# Patient Record
Sex: Female | Born: 2000 | Race: White | Hispanic: No | Marital: Single | Smoking: Never smoker
Health system: Southern US, Community
[De-identification: ages and names within clinical notes are randomized; demographics above are authoritative.]

## PROBLEM LIST (undated history)

## (undated) DIAGNOSIS — K219 Gastro-esophageal reflux disease without esophagitis: Secondary | ICD-10-CM

## (undated) DIAGNOSIS — R1013 Epigastric pain: Secondary | ICD-10-CM

## (undated) DIAGNOSIS — J45909 Unspecified asthma, uncomplicated: Secondary | ICD-10-CM

## (undated) HISTORY — DX: Epigastric pain: R10.13

## (undated) HISTORY — PX: WISDOM TOOTH EXTRACTION: SHX21

---
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
## 2020-01-12 MED FILL — ADAPALENE 0.3% GEL: 0.3 | 30 days supply | Qty: 45 | Fill #1

## 2020-01-15 MED FILL — SOD SULFACET-SULFUR 10-5% C: 10-5 | 30 days supply | Qty: 170 | Fill #0

## 2020-01-22 DIAGNOSIS — L700 Acne vulgaris: Secondary | ICD-10-CM | POA: Diagnosis not present

## 2020-01-22 DIAGNOSIS — L218 Other seborrheic dermatitis: Secondary | ICD-10-CM | POA: Diagnosis not present

## 2020-01-22 MED FILL — FLUOCINONIDE 0.05 % SOLN: 0.05 | 30 days supply | Qty: 60 | Fill #0

## 2020-01-31 MED FILL — KETOCONAZOLE 2 % SHAM: 2 | 30 days supply | Qty: 120 | Fill #0

## 2020-02-15 ENCOUNTER — Ambulatory Visit: Payer: 59 | Attending: Internal Medicine

## 2020-02-15 DIAGNOSIS — Z23 Encounter for immunization: Secondary | ICD-10-CM

## 2020-02-15 NOTE — Progress Notes (Signed)
   Covid-19 Vaccination Clinic  Name:  Azalyn Sliwa    MRN: [id number] DOB: 2001/01/04  02/15/2020  Ms. Garraway was observed post Covid-19 immunization for 15 minutes without incident. She was provided with Vaccine Information Sheet and instruction to access the V-Safe system.   Ms. Eblen was instructed to call 911 with any severe reactions post vaccine: Marland Kitchen Difficulty breathing  . Swelling of face and throat  . A fast heartbeat  . A bad rash all over body  . Dizziness and weakness   Immunizations Administered    Name Date Dose VIS Date Route   Pfizer COVID-19 Vaccine 02/15/2020 10:21 AM 0.3 mL 10/20/2019 Intramuscular   Manufacturer: [employer], [employer]   Lot: [id number]   NDC: [address]-3

## 2020-03-20 ENCOUNTER — Ambulatory Visit: Payer: 59 | Attending: Internal Medicine

## 2020-03-20 DIAGNOSIS — Z23 Encounter for immunization: Secondary | ICD-10-CM

## 2020-03-20 NOTE — Progress Notes (Signed)
   Covid-19 Vaccination Clinic  Name:  Amanda Hester    MRN: [id number] DOB: 2000-12-26  03/20/2020  Ms. Cancel was observed post Covid-19 immunization for 15 minutes without incident. She was provided with Vaccine Information Sheet and instruction to access the V-Safe system.   [name] was instructed to call 911 with any severe reactions post vaccine: Marland Kitchen Difficulty breathing  . Swelling of face and throat  . A fast heartbeat  . A bad rash all over body  . Dizziness and weakness   Immunizations Administered    Name Date Dose VIS Date Route   Pfizer COVID-19 Vaccine 03/20/2020 10:08 AM 0.3 mL 01/03/2019 Intramuscular   Manufacturer: [employer], [employer]   Lot: [id number]   NDC: [address]-0

## 2020-03-26 MED FILL — SOD SULFACET-SULFUR 10-5% C: 10-5 | 30 days supply | Qty: 170 | Fill #1

## 2020-03-28 MED FILL — KETOROLAC 10 MG TABLET: 10 | 4 days supply | Qty: 16 | Fill #0

## 2020-03-28 MED FILL — HYDROCODON-APAP 7.5-325: 7.5-325 | 2 days supply | Qty: 8 | Fill #0

## 2020-04-02 DIAGNOSIS — M79651 Pain in right thigh: Secondary | ICD-10-CM | POA: Diagnosis not present

## 2020-05-17 DIAGNOSIS — Z130 Encounter for screening for diseases of the blood and blood-forming organs and certain disorders involving the immune mechanism: Secondary | ICD-10-CM | POA: Diagnosis not present

## 2020-05-17 DIAGNOSIS — J301 Allergic rhinitis due to pollen: Secondary | ICD-10-CM | POA: Diagnosis not present

## 2020-05-17 DIAGNOSIS — J45990 Exercise induced bronchospasm: Secondary | ICD-10-CM | POA: Diagnosis not present

## 2020-05-17 MED FILL — ALBUTEROL SULFATE HFA 108 (: 108 (90 BAS | 16 days supply | Qty: 9 | Fill #0

## 2020-05-21 MED FILL — KETOCONAZOLE 2 % SHAM: 2 | 30 days supply | Qty: 120 | Fill #0

## 2020-05-21 MED FILL — DOXYCYCLINE HYC 50 MG CAP: 50 | 20 days supply | Qty: 20 | Fill #1

## 2020-05-21 MED FILL — SOD SULFACET-SULFUR 10-5% C: 10-5 | 30 days supply | Qty: 170 | Fill #2

## 2020-08-12 DIAGNOSIS — M25572 Pain in left ankle and joints of left foot: Secondary | ICD-10-CM | POA: Diagnosis not present

## 2020-08-12 DIAGNOSIS — S93492A Sprain of other ligament of left ankle, initial encounter: Secondary | ICD-10-CM | POA: Diagnosis not present

## 2020-08-21 DIAGNOSIS — S93492D Sprain of other ligament of left ankle, subsequent encounter: Secondary | ICD-10-CM | POA: Diagnosis not present

## 2020-08-21 DIAGNOSIS — M25572 Pain in left ankle and joints of left foot: Secondary | ICD-10-CM | POA: Diagnosis not present

## 2020-08-26 MED FILL — SOD SULFACET-SULFUR 10-5% C: 10-5 | 30 days supply | Qty: 170 | Fill #3

## 2020-08-26 MED FILL — FLUOCINONIDE 0.05 % SOLN: 0.05 | 30 days supply | Qty: 60 | Fill #0

## 2020-08-26 MED FILL — ADAPALENE 0.3% GEL: 0.3 | 30 days supply | Qty: 45 | Fill #2

## 2020-08-27 ENCOUNTER — Other Ambulatory Visit (HOSPITAL_COMMUNITY): Payer: Self-pay | Admitting: Specialist

## 2020-08-27 MED FILL — DOXYCYCLINE HYC 50 MG CAP: 50 | 50 days supply | Qty: 50 | Fill #0

## 2020-09-02 MED FILL — KETOCONAZOLE 2 % SHAM: 2 | 30 days supply | Qty: 120 | Fill #1

## 2020-09-19 DIAGNOSIS — R600 Localized edema: Secondary | ICD-10-CM | POA: Diagnosis not present

## 2020-09-19 DIAGNOSIS — S93422A Sprain of deltoid ligament of left ankle, initial encounter: Secondary | ICD-10-CM | POA: Diagnosis not present

## 2020-09-19 DIAGNOSIS — S93492A Sprain of other ligament of left ankle, initial encounter: Secondary | ICD-10-CM | POA: Diagnosis not present

## 2020-09-19 DIAGNOSIS — S93412A Sprain of calcaneofibular ligament of left ankle, initial encounter: Secondary | ICD-10-CM | POA: Diagnosis not present

## 2020-09-19 DIAGNOSIS — M25571 Pain in right ankle and joints of right foot: Secondary | ICD-10-CM | POA: Diagnosis not present

## 2020-09-25 DIAGNOSIS — S93492D Sprain of other ligament of left ankle, subsequent encounter: Secondary | ICD-10-CM | POA: Diagnosis not present

## 2020-09-27 DIAGNOSIS — M79672 Pain in left foot: Secondary | ICD-10-CM | POA: Diagnosis not present

## 2020-09-27 DIAGNOSIS — M25572 Pain in left ankle and joints of left foot: Secondary | ICD-10-CM | POA: Diagnosis not present

## 2020-09-27 DIAGNOSIS — S93492A Sprain of other ligament of left ankle, initial encounter: Secondary | ICD-10-CM | POA: Diagnosis not present

## 2020-10-01 ENCOUNTER — Ambulatory Visit: Payer: 59 | Admitting: Podiatry

## 2020-10-15 ENCOUNTER — Encounter (INDEPENDENT_AMBULATORY_CARE_PROVIDER_SITE_OTHER): Payer: Self-pay | Admitting: Student in an Organized Health Care Education/Training Program

## 2020-10-23 DIAGNOSIS — S93492A Sprain of other ligament of left ankle, initial encounter: Secondary | ICD-10-CM | POA: Diagnosis not present

## 2020-10-23 DIAGNOSIS — M25372 Other instability, left ankle: Secondary | ICD-10-CM | POA: Diagnosis not present

## 2020-10-28 ENCOUNTER — Other Ambulatory Visit: Payer: Self-pay | Admitting: Orthopaedic Surgery

## 2020-10-28 ENCOUNTER — Other Ambulatory Visit (HOSPITAL_COMMUNITY): Payer: Self-pay | Admitting: Orthopaedic Surgery

## 2020-10-28 DIAGNOSIS — M25372 Other instability, left ankle: Secondary | ICD-10-CM

## 2020-10-30 ENCOUNTER — Encounter (HOSPITAL_BASED_OUTPATIENT_CLINIC_OR_DEPARTMENT_OTHER): Payer: Self-pay | Admitting: Orthopaedic Surgery

## 2020-10-30 ENCOUNTER — Other Ambulatory Visit: Payer: Self-pay

## 2020-11-04 ENCOUNTER — Other Ambulatory Visit: Payer: Self-pay

## 2020-11-04 ENCOUNTER — Other Ambulatory Visit (HOSPITAL_COMMUNITY)
Admission: RE | Admit: 2020-11-04 | Discharge: 2020-11-04 | Disposition: A | Discharge status: Home / Self Care | Payer: 59 | Source: Ambulatory Visit

## 2020-11-04 ENCOUNTER — Ambulatory Visit (HOSPITAL_COMMUNITY)
Admission: RE | Admit: 2020-11-04 | Discharge: 2020-11-04 | Disposition: A | Discharge status: Home / Self Care | Payer: 59 | Source: Ambulatory Visit | Attending: Orthopaedic Surgery | Admitting: Orthopaedic Surgery

## 2020-11-04 DIAGNOSIS — M25372 Other instability, left ankle: Secondary | ICD-10-CM | POA: Diagnosis not present

## 2020-11-04 DIAGNOSIS — Z20822 Contact with and (suspected) exposure to covid-19: Secondary | ICD-10-CM | POA: Diagnosis not present

## 2020-11-04 DIAGNOSIS — S99912A Unspecified injury of left ankle, initial encounter: Secondary | ICD-10-CM | POA: Diagnosis not present

## 2020-11-04 MED FILL — KETOCONAZOLE 2 % SHAM: 2 | 30 days supply | Qty: 120 | Fill #2

## 2020-11-04 MED FILL — SOD SULFACET-SULFUR 10-5% C: 10-5 | 30 days supply | Qty: 170 | Fill #4

## 2020-11-04 MED FILL — ADAPALENE 0.3% GEL: 0.3 | 30 days supply | Qty: 45 | Fill #3

## 2020-11-05 LAB — SARS CORONAVIRUS 2 (TAT 6-24 HRS): SARS Coronavirus 2: NEGATIVE

## 2020-11-05 NOTE — Progress Notes (Signed)

## 2020-11-06 NOTE — Anesthesia Preprocedure Evaluation (Addendum)
Anesthesia Evaluation  Patient identified by MRN, date of birth, ID band Patient awake    Reviewed: Allergy & Precautions, NPO status , Patient's Chart, lab work & pertinent test results  Airway Mallampati: II  TM Distance: >3 FB Neck ROM: Full    Dental no notable dental hx. (+) Teeth Intact, Dental Advisory Given   Pulmonary neg pulmonary ROS,    Pulmonary exam normal breath sounds clear to auscultation       Cardiovascular Exercise Tolerance: Good Normal cardiovascular exam Rhythm:Regular Rate:Normal     Neuro/Psych negative neurological ROS     GI/Hepatic Neg liver ROS, GERD  ,  Endo/Other  negative endocrine ROS  Renal/GU negative Renal ROS     Musculoskeletal negative musculoskeletal ROS (+)   Abdominal   Peds  Hematology negative hematology ROS (+)   Anesthesia Other Findings   Reproductive/Obstetrics                            Anesthesia Physical Anesthesia Plan  ASA: I  Anesthesia Plan: General   Post-op Pain Management:    Induction: Intravenous  PONV Risk Score and Plan: Treatment may vary due to age or medical condition, Ondansetron, Dexamethasone and Midazolam  Airway Management Planned: LMA  Additional Equipment: None  Intra-op Plan:   Post-operative Plan:   Informed Consent: I have reviewed the patients History and Physical, chart, labs and discussed the procedure including the risks, benefits and alternatives for the proposed anesthesia with the patient or authorized representative who has indicated his/her understanding and acceptance.     Dental advisory given  Plan Discussed with: CRNA and Anesthesiologist  Anesthesia Plan Comments: (LMA w L popliteal + adductor)       Anesthesia Quick Evaluation

## 2020-11-07 ENCOUNTER — Other Ambulatory Visit: Payer: Self-pay

## 2020-11-07 ENCOUNTER — Ambulatory Visit (HOSPITAL_BASED_OUTPATIENT_CLINIC_OR_DEPARTMENT_OTHER)
Admission: RE | Admit: 2020-11-07 | Discharge: 2020-11-07 | Disposition: A | Discharge status: Home / Self Care | Payer: 59 | Attending: Orthopaedic Surgery | Admitting: Orthopaedic Surgery

## 2020-11-07 ENCOUNTER — Ambulatory Visit (HOSPITAL_BASED_OUTPATIENT_CLINIC_OR_DEPARTMENT_OTHER): Payer: 59 | Admitting: Anesthesiology

## 2020-11-07 ENCOUNTER — Other Ambulatory Visit (HOSPITAL_BASED_OUTPATIENT_CLINIC_OR_DEPARTMENT_OTHER): Payer: Self-pay | Admitting: Orthopaedic Surgery

## 2020-11-07 ENCOUNTER — Encounter (HOSPITAL_BASED_OUTPATIENT_CLINIC_OR_DEPARTMENT_OTHER): Payer: Self-pay | Admitting: Orthopaedic Surgery

## 2020-11-07 ENCOUNTER — Encounter (HOSPITAL_BASED_OUTPATIENT_CLINIC_OR_DEPARTMENT_OTHER)
Admission: RE | Disposition: A | Discharge status: Home / Self Care | Payer: Self-pay | Source: Home / Self Care | Attending: Orthopaedic Surgery

## 2020-11-07 DIAGNOSIS — Y9366 Activity, soccer: Secondary | ICD-10-CM | POA: Diagnosis not present

## 2020-11-07 DIAGNOSIS — M25372 Other instability, left ankle: Secondary | ICD-10-CM | POA: Insufficient documentation

## 2020-11-07 DIAGNOSIS — M25872 Other specified joint disorders, left ankle and foot: Secondary | ICD-10-CM | POA: Insufficient documentation

## 2020-11-07 DIAGNOSIS — Z79899 Other long term (current) drug therapy: Secondary | ICD-10-CM | POA: Diagnosis not present

## 2020-11-07 DIAGNOSIS — G8918 Other acute postprocedural pain: Secondary | ICD-10-CM | POA: Diagnosis not present

## 2020-11-07 DIAGNOSIS — M19072 Primary osteoarthritis, left ankle and foot: Secondary | ICD-10-CM | POA: Diagnosis not present

## 2020-11-07 HISTORY — DX: Unspecified asthma, uncomplicated: J45.909

## 2020-11-07 HISTORY — DX: Gastro-esophageal reflux disease without esophagitis: K21.9

## 2020-11-07 HISTORY — PX: ANKLE ARTHROSCOPY WITH RECONSTRUCTION: SHX5583

## 2020-11-07 LAB — POCT PREGNANCY, URINE: Preg Test, Ur: NEGATIVE

## 2020-11-07 SURGERY — ARTHROSCOPY, ANKLE, WITH RECONSTRUCTION
Anesthesia: Regional | Site: Ankle | Laterality: Left

## 2020-11-07 MED ORDER — CEFAZOLIN SODIUM-DEXTROSE 2-4 GM/100ML-% IV SOLN
2.0000 g | INTRAVENOUS | Status: AC
  Administered 2020-11-07: 10:00:00 2 g via INTRAVENOUS

## 2020-11-07 MED ORDER — DEXAMETHASONE SODIUM PHOSPHATE 10 MG/ML IJ SOLN
INTRAMUSCULAR | Status: DC | PRN
  Administered 2020-11-07: 5 mg via INTRAVENOUS

## 2020-11-07 MED ORDER — DEXMEDETOMIDINE (PRECEDEX) IN NS 20 MCG/5ML (4 MCG/ML) IV SYRINGE
PREFILLED_SYRINGE | INTRAVENOUS | Status: AC
  Filled 2020-11-07: qty 5

## 2020-11-07 MED ORDER — DEXMEDETOMIDINE (PRECEDEX) IN NS 20 MCG/5ML (4 MCG/ML) IV SYRINGE
PREFILLED_SYRINGE | INTRAVENOUS | Status: DC | PRN
  Administered 2020-11-07: 8 ug via INTRAVENOUS

## 2020-11-07 MED ORDER — GLYCOPYRROLATE 0.2 MG/ML IJ SOLN
INTRAMUSCULAR | Status: DC | PRN
  Administered 2020-11-07: .2 mg via INTRAVENOUS

## 2020-11-07 MED ORDER — OXYCODONE HCL 5 MG/5ML PO SOLN
5.0000 mg | Freq: Once | ORAL | Status: DC | PRN
Start: 2020-11-07 — End: 2020-11-07

## 2020-11-07 MED ORDER — MIDAZOLAM HCL 2 MG/2ML IJ SOLN
INTRAMUSCULAR | Status: AC
  Filled 2020-11-07: qty 2

## 2020-11-07 MED ORDER — FENTANYL CITRATE (PF) 100 MCG/2ML IJ SOLN
INTRAMUSCULAR | Status: DC | PRN
  Administered 2020-11-07: 50 ug via INTRAVENOUS
  Administered 2020-11-07: 25 ug via INTRAVENOUS

## 2020-11-07 MED ORDER — MIDAZOLAM HCL 2 MG/2ML IJ SOLN
1.0000 mg | Freq: Once | INTRAMUSCULAR | Status: AC
  Administered 2020-11-07: 09:00:00 2 mg via INTRAVENOUS

## 2020-11-07 MED ORDER — FENTANYL CITRATE (PF) 100 MCG/2ML IJ SOLN
50.0000 ug | Freq: Once | INTRAMUSCULAR | Status: AC
  Administered 2020-11-07: 09:00:00 100 ug via INTRAVENOUS

## 2020-11-07 MED ORDER — LACTATED RINGERS IV SOLN: INTRAVENOUS | Status: DC

## 2020-11-07 MED ORDER — PROPOFOL 10 MG/ML IV BOLUS
INTRAVENOUS | Status: DC | PRN
  Administered 2020-11-07: 120 mg via INTRAVENOUS

## 2020-11-07 MED ORDER — OXYCODONE HCL 5 MG PO TABS: 5.0000 mg | ORAL_TABLET | Freq: Once | ORAL | Status: DC | PRN

## 2020-11-07 MED ORDER — LIDOCAINE 2% (20 MG/ML) 5 ML SYRINGE
INTRAMUSCULAR | Status: AC
  Filled 2020-11-07: qty 5

## 2020-11-07 MED ORDER — ONDANSETRON HCL 4 MG/2ML IJ SOLN
INTRAMUSCULAR | Status: AC
  Filled 2020-11-07: qty 2

## 2020-11-07 MED ORDER — KETOROLAC TROMETHAMINE 30 MG/ML IJ SOLN
INTRAMUSCULAR | Status: AC
  Filled 2020-11-07: qty 1

## 2020-11-07 MED ORDER — PROPOFOL 10 MG/ML IV BOLUS
INTRAVENOUS | Status: AC
  Filled 2020-11-07: qty 20

## 2020-11-07 MED ORDER — FENTANYL CITRATE (PF) 100 MCG/2ML IJ SOLN
INTRAMUSCULAR | Status: AC
  Filled 2020-11-07: qty 2

## 2020-11-07 MED ORDER — OXYCODONE HCL 5 MG PO TABS: 5.0000 mg | ORAL_TABLET | ORAL | 0 refills | Status: DC | PRN

## 2020-11-07 MED ORDER — LIDOCAINE HCL (CARDIAC) PF 100 MG/5ML IV SOSY
PREFILLED_SYRINGE | INTRAVENOUS | Status: DC | PRN
  Administered 2020-11-07: 100 mg via INTRATRACHEAL

## 2020-11-07 MED ORDER — ONDANSETRON HCL 4 MG/2ML IJ SOLN
INTRAMUSCULAR | Status: DC | PRN
  Administered 2020-11-07: 4 mg via INTRAVENOUS

## 2020-11-07 MED ORDER — DEXAMETHASONE SODIUM PHOSPHATE 10 MG/ML IJ SOLN
INTRAMUSCULAR | Status: AC
  Filled 2020-11-07: qty 1

## 2020-11-07 MED ORDER — EPHEDRINE 5 MG/ML INJ
INTRAVENOUS | Status: AC
  Filled 2020-11-07: qty 10

## 2020-11-07 MED ORDER — HYDROMORPHONE HCL 1 MG/ML IJ SOLN: 0.2500 mg | INTRAMUSCULAR | Status: DC | PRN

## 2020-11-07 MED ORDER — ROPIVACAINE HCL 5 MG/ML IJ SOLN
INTRAMUSCULAR | Status: DC | PRN
  Administered 2020-11-07: 20 mL via PERINEURAL

## 2020-11-07 MED ORDER — CEFAZOLIN SODIUM-DEXTROSE 2-4 GM/100ML-% IV SOLN
INTRAVENOUS | Status: AC
  Filled 2020-11-07: qty 100

## 2020-11-07 MED ORDER — GLYCOPYRROLATE PF 0.2 MG/ML IJ SOSY
PREFILLED_SYRINGE | INTRAMUSCULAR | Status: AC
  Filled 2020-11-07: qty 1

## 2020-11-07 MED ORDER — ROPIVACAINE HCL 7.5 MG/ML IJ SOLN
INTRAMUSCULAR | Status: DC | PRN
  Administered 2020-11-07: 20 mL via PERINEURAL

## 2020-11-07 MED ORDER — ONDANSETRON HCL 4 MG/2ML IJ SOLN
4.0000 mg | Freq: Once | INTRAMUSCULAR | Status: AC | PRN
  Administered 2020-11-07: 13:00:00 4 mg via INTRAVENOUS

## 2020-11-07 MED ORDER — KETOROLAC TROMETHAMINE 30 MG/ML IJ SOLN
30.0000 mg | Freq: Once | INTRAMUSCULAR | Status: AC | PRN
  Administered 2020-11-07: 30 mg via INTRAVENOUS

## 2020-11-07 MED ORDER — ROCURONIUM BROMIDE 10 MG/ML (PF) SYRINGE
PREFILLED_SYRINGE | INTRAVENOUS | Status: AC
  Filled 2020-11-07: qty 10

## 2020-11-07 MED ORDER — ARTIFICIAL TEARS OPHTHALMIC OINT
TOPICAL_OINTMENT | OPHTHALMIC | Status: AC
  Filled 2020-11-07: qty 3.5

## 2020-11-07 MED ORDER — MIDAZOLAM HCL 2 MG/2ML IJ SOLN
INTRAMUSCULAR | Status: DC | PRN
  Administered 2020-11-07: 2 mg via INTRAVENOUS

## 2020-11-07 MED ORDER — EPHEDRINE SULFATE 50 MG/ML IJ SOLN
INTRAMUSCULAR | Status: DC | PRN
  Administered 2020-11-07: 10 mg via INTRAVENOUS

## 2020-11-07 MED ORDER — CLONIDINE HCL (ANALGESIA) 100 MCG/ML EP SOLN
EPIDURAL | Status: DC | PRN
  Administered 2020-11-07 (×2): 100 ug

## 2020-11-07 MED FILL — oxyCODONE HCL 5 MG TABS: 5 | 7 days supply | Qty: 40 | Fill #0

## 2020-11-07 SURGICAL SUPPLY — 79 items
ANCH SUT 0 2DMD BSUT TK 14.5X3 (Anchor) ×1 IMPLANT
ANCHOR BIO-SUTURETAK 0 FWIRE (Anchor) ×2 IMPLANT
APL PRP STRL LF DISP 70% ISPRP (MISCELLANEOUS) ×2
APL SKNCLS STERI-STRIP NONHPOA (GAUZE/BANDAGES/DRESSINGS)
BANDAGE ESMARK 6X9 LF (GAUZE/BANDAGES/DRESSINGS) IMPLANT
BENZOIN TINCTURE PRP APPL 2/3 (GAUZE/BANDAGES/DRESSINGS) IMPLANT
BLADE SURG 15 STRL LF DISP TIS (BLADE) ×1 IMPLANT
BLADE SURG 15 STRL SS (BLADE) ×2
BNDG CMPR 9X4 STRL LF SNTH (GAUZE/BANDAGES/DRESSINGS)
BNDG CMPR 9X6 STRL LF SNTH (GAUZE/BANDAGES/DRESSINGS)
BNDG ELASTIC 6X5.8 VLCR STR LF (GAUZE/BANDAGES/DRESSINGS) ×4 IMPLANT
BNDG ESMARK 4X9 LF (GAUZE/BANDAGES/DRESSINGS) IMPLANT
BNDG ESMARK 6X9 LF (GAUZE/BANDAGES/DRESSINGS)
CHLORAPREP W/TINT 26 (MISCELLANEOUS) ×4 IMPLANT
COVER WAND RF STERILE (DRAPES) IMPLANT
CUFF TOURN SGL QUICK 34 (TOURNIQUET CUFF) ×2
CUFF TRNQT CYL 34X4.125X (TOURNIQUET CUFF) ×1 IMPLANT
DECANTER SPIKE VIAL GLASS SM (MISCELLANEOUS) IMPLANT
DRAPE EXTREMITY T 121X128X90 (DISPOSABLE) ×2 IMPLANT
DRAPE OEC MINIVIEW 54X84 (DRAPES) IMPLANT
DRAPE U-SHAPE 47X51 STRL (DRAPES) ×2 IMPLANT
DRSG PAD ABDOMINAL 8X10 ST (GAUZE/BANDAGES/DRESSINGS) ×2 IMPLANT
ELECT REM PT RETURN 9FT ADLT (ELECTROSURGICAL)
ELECTRODE REM PT RTRN 9FT ADLT (ELECTROSURGICAL) IMPLANT
EXCALIBUR 3.8MM X 13CM (MISCELLANEOUS) ×2 IMPLANT
GAUZE SPONGE 4X4 12PLY STRL (GAUZE/BANDAGES/DRESSINGS) ×2 IMPLANT
GAUZE XEROFORM 1X8 LF (GAUZE/BANDAGES/DRESSINGS) ×2 IMPLANT
GLOVE ECLIPSE 6.5 STRL STRAW (GLOVE) ×2 IMPLANT
GLOVE SRG 8 PF TXTR STRL LF DI (GLOVE) ×1 IMPLANT
GLOVE SURG ENC TEXT LTX SZ7.5 (GLOVE) ×4 IMPLANT
GLOVE SURG UNDER POLY LF SZ7 (GLOVE) ×4 IMPLANT
GLOVE SURG UNDER POLY LF SZ8 (GLOVE) ×2
GOWN STRL REUS W/ TWL LRG LVL3 (GOWN DISPOSABLE) ×1 IMPLANT
GOWN STRL REUS W/ TWL XL LVL3 (GOWN DISPOSABLE) ×1 IMPLANT
GOWN STRL REUS W/TWL LRG LVL3 (GOWN DISPOSABLE) ×2
GOWN STRL REUS W/TWL XL LVL3 (GOWN DISPOSABLE) ×2
IMPL INTERNAL BRACE BIO (Anchor) ×1 IMPLANT
IMPLANT INTERNAL BRACE BIO (Anchor) ×2 IMPLANT
KIT SUTURETAK 2.4 DRILL BIT (KITS) ×2 IMPLANT
KIT SUTURETAK 3 SPEAR TROCAR (KITS) IMPLANT
MANIFOLD NEPTUNE II (INSTRUMENTS) ×2 IMPLANT
NDL SUT 6 .5 CRC .975X.05 MAYO (NEEDLE) IMPLANT
NEEDLE KEITH (NEEDLE) IMPLANT
NEEDLE MAYO TAPER (NEEDLE)
NS IRRIG 1000ML POUR BTL (IV SOLUTION) ×2 IMPLANT
PACK ARTHROSCOPY DSU (CUSTOM PROCEDURE TRAY) ×2 IMPLANT
PACK BASIN DAY SURGERY FS (CUSTOM PROCEDURE TRAY) ×2 IMPLANT
PADDING CAST SYNTHETIC 4 (CAST SUPPLIES) ×2
PADDING CAST SYNTHETIC 4X4 STR (CAST SUPPLIES) ×2 IMPLANT
PENCIL SMOKE EVACUATOR (MISCELLANEOUS) IMPLANT
SHAVER DISSECTOR 3.0 (BURR) IMPLANT
SHAVER SABRE 2.0 (BURR) IMPLANT
SHEET MEDIUM DRAPE 40X70 STRL (DRAPES) ×2 IMPLANT
SLEEVE SCD COMPRESS KNEE MED (MISCELLANEOUS) ×2 IMPLANT
SPLINT FAST PLASTER 5X30 (CAST SUPPLIES)
SPLINT PLASTER CAST FAST 5X30 (CAST SUPPLIES) IMPLANT
SPONGE LAP 18X18 RF (DISPOSABLE) IMPLANT
STOCKINETTE 6  STRL (DRAPES) ×2
STOCKINETTE 6 STRL (DRAPES) ×1 IMPLANT
STRAP ANKLE FOOT DISTRACTOR (ORTHOPEDIC SUPPLIES) ×2 IMPLANT
STRIP CLOSURE SKIN 1/2X4 (GAUZE/BANDAGES/DRESSINGS) IMPLANT
SUCTION FRAZIER HANDLE 10FR (MISCELLANEOUS) ×2
SUCTION TUBE FRAZIER 10FR DISP (MISCELLANEOUS) ×1 IMPLANT
SUT BONE WAX W31G (SUTURE) ×2 IMPLANT
SUT ETHILON 3 0 PS 1 (SUTURE) ×2 IMPLANT
SUT FIBERWIRE #2 38 T-5 BLUE (SUTURE)
SUT FIBERWIRE 2-0 18 17.9 3/8 (SUTURE)
SUT MNCRL AB 3-0 PS2 18 (SUTURE) ×2 IMPLANT
SUT PDS AB 2-0 CT2 27 (SUTURE) ×2 IMPLANT
SUT VIC AB 0 SH 27 (SUTURE) IMPLANT
SUT VIC AB 2-0 SH 27 (SUTURE) ×2
SUT VIC AB 2-0 SH 27XBRD (SUTURE) ×1 IMPLANT
SUT VIC AB 3-0 FS2 27 (SUTURE) IMPLANT
SUTURE FIBERWR #2 38 T-5 BLUE (SUTURE) IMPLANT
SUTURE FIBERWR 2-0 18 17.9 3/8 (SUTURE) IMPLANT
SYR BULB EAR ULCER 3OZ GRN STR (SYRINGE) IMPLANT
TOWEL GREEN STERILE FF (TOWEL DISPOSABLE) ×4 IMPLANT
TUBING ARTHROSCOPY IRRIG 16FT (MISCELLANEOUS) ×2 IMPLANT
UNDERPAD 30X36 HEAVY ABSORB (UNDERPADS AND DIAPERS) ×2 IMPLANT

## 2020-11-07 NOTE — Anesthesia Procedure Notes (Signed)
Anesthesia Regional Block: Popliteal block   Pre-Anesthetic Checklist: ,, timeout performed, Correct Patient, Correct Site, Correct Laterality, Correct Procedure, Correct Position, site marked, Risks and benefits discussed, pre-op evaluation,  At surgeon's request and post-op pain management  Laterality: Left and Lower  Prep: Maximum Sterile Barrier Precautions used, chloraprep       Needles:  Injection technique: Single-shot  Needle Type: Echogenic Needle     Needle Length: 9cm  Needle Gauge: 21     Additional Needles:   Procedures:,,,, ultrasound used (permanent image in chart),,,,  Narrative:  Start time: 11/07/2020 8:46 AM End time: 11/07/2020 8:54 AM Injection made incrementally with aspirations every 5 mL.  Performed by: Personally  Anesthesiologist: Trevor Iha, MD  Additional Notes: Block assessed. Patient tolerated procedure well.

## 2020-11-07 NOTE — Anesthesia Procedure Notes (Signed)
Anesthesia Regional Block: Adductor canal block   Pre-Anesthetic Checklist: ,, timeout performed, Correct Patient, Correct Site, Correct Laterality, Correct Procedure, Correct Position, site marked, Risks and benefits discussed,  Surgical consent,  Pre-op evaluation,  At surgeon's request and post-op pain management  Laterality: Lower and Left  Prep: chloraprep       Needles:  Injection technique: Single-shot  Needle Type: Echogenic Needle     Needle Length: 9cm  Needle Gauge: 22     Additional Needles:   Procedures:,,,, ultrasound used (permanent image in chart),,,,  Narrative:  Start time: 11/07/2020 8:56 AM End time: 11/07/2020 9:02 AM Injection made incrementally with aspirations every 5 mL.  Performed by: Personally  Anesthesiologist: Trevor Iha, MD  Additional Notes: Block assessed prior to surgery. Pt tolerated procedure well.

## 2020-11-07 NOTE — Anesthesia Postprocedure Evaluation (Signed)
Anesthesia Post Note  Patient: Amanda Hester  Procedure(s) Performed: LEFT ANKLE ARTHROSCOPIC DEBRIDEMENT,     ARTHROSCOPIC TREATMENT OF TALUS OSTEOCHONDRAL LESION, LATERAL LIGAMENT RECONSTRUCTION (Left Ankle)     Patient location during evaluation: PACU Anesthesia Type: Regional and General Level of consciousness: awake and alert Pain management: pain level controlled Vital Signs Assessment: post-procedure vital signs reviewed and stable Respiratory status: spontaneous breathing, nonlabored ventilation, respiratory function stable and patient connected to nasal cannula oxygen Cardiovascular status: blood pressure returned to baseline and stable Postop Assessment: no apparent nausea or vomiting Anesthetic complications: no   No complications documented.  Last Vitals:  Vitals:   11/07/20 1123 11/07/20 1200  BP:  (!) 101/49  Pulse: (!) 58 (!) 49  Resp: 16 (!) 27  Temp: 37 C   SpO2: 100% 98%    Last Pain:  Vitals:   11/07/20 1122  TempSrc:   PainSc: 0-No pain                 Trevor Iha

## 2020-11-07 NOTE — Anesthesia Procedure Notes (Signed)
Procedure Name: LMA Insertion Date/Time: 11/07/2020 9:39 AM Performed by: Salomon Mast, CRNA Pre-anesthesia Checklist: Patient identified, Emergency Drugs available, Suction available and Patient being monitored Patient Re-evaluated:Patient Re-evaluated prior to induction Oxygen Delivery Method: Circle system utilized Preoxygenation: Pre-oxygenation with 100% oxygen Induction Type: IV induction LMA: LMA inserted LMA Size: 4.0 Number of attempts: 1 Placement Confirmation: positive ETCO2 and breath sounds checked- equal and bilateral Tube secured with: Tape Dental Injury: Teeth and Oropharynx as per pre-operative assessment

## 2020-11-07 NOTE — Op Note (Signed)
Amanda Hester female 19 y.o. 11/07/2020  PreOperative Diagnosis: Left ankle impingement Left ankle instability   PostOperative Diagnosis: Same  PROCEDURE: Left ankle arthroscopic debridement, extensive Left ankle lateral ligament reconstruction, secondary  SURGEON: Dub Mikes, MD  ASSISTANT: none  ANESTHESIA: General LMA with popliteal nerve block  FINDINGS: Synovitis and scarring within the anterior portion of the ankle joint commensurate with injury.  No definitive cartilage injury.  Small contusion to the medial malleolus that was extra-articular. Complete disruption of the lateral ligament complex  IMPLANTS: Arthrex suture tack Internal brace  INDICATIONS:19 y.o. female sustained an inversion ankle injury while playing soccer at [employer] in October.  She had complete disruption of her lateral ligaments and failed conservative treatment.  Patient continued pain in the anterior ankle as well as lateral left ankle.  She had continued swelling.  Patient failed boot immobilization, physical therapy, anti-inflammatories and was indicated for surgery due to gross instability.  MRI of demonstrated lateral ligament incompetence and contusion of the medial malleolus and medial talus.   Patient understood the risks, benefits and alternatives to surgery which include but are not limited to wound healing complications, infection, nonunion, malunion, need for further surgery as well as damage to surrounding structures. They also understood the potential for continued pain in that there were no guarantees of acceptable outcome After weighing these risks the patient opted to proceed with surgery.  PROCEDURE: Patient was identified in the preoperative holding area.  The left leg was marked by myself.  Consent was signed by myself and the patient.  Block was performed by anesthesia in the preoperative holding area.  Patient was taken to the operative suite and  placed supine on the operative table.  General LMA anesthesia was induced without difficulty. Bump was placed under the operative hip and bone foam was used.  All bony prominences were well padded.  Tourniquet was placed on the operative thigh.  Preoperative antibiotics were given. The extremity was prepped and draped in the usual sterile fashion and surgical timeout was performed.  The ankle distractor was placed.  The limb was elevated and the tourniquet was inflated to 250 mmHg.  We began by using an 18-gauge needle to insufflate the ankle joint with 20 cc of normal saline.  We then began by making an anteromedial portal to the ankle joint.  This was done with an 11 blade through the skin.  Then blunt dissection was used with a hemostat down to the capsule.  Then the capsule was violated and the joint entered.  Then the trocar with the camera was placed.  There was a large amount of synovitis within the ankle joint.     There is also evidence of the scarring of the internal capsule.  There was significant scarring within the anterior ankle gutter.  Then a lateral portal was placed in a similar fashion.  The probe was placed to remove the synovitic tissue and the joint surfaces were inspected.    Upon inspection of the joint there was found to be without evidence of cartilage wear or chondrosis.  There is a small dent in the cartilage on the distal aspect of the anterior medial malleolus.  No loose cartilage.  Then the probe was removed and the shaver was inserted.  Extensive debridement was done of the ankle joint including synovial tissue and evidence of chondral impingement on the anterior distal tibial plafond and bone..    The joint surfaces were inspected for any evidence of loose cartilage.  There was some remaining chondrosis notably within the lateral gutter and of the lateral anterior tibial plafond.  There is also some synovitis within the syndesmosis that was debrided back. This was debrided  extensively. This completed the arthroscopic debridement portion of the case.   We then turned our attention to the lateral ligaments.  A longitudinal incision in an oblique fashion was made overlying the lateral ligament complex.  This taken sharply down through skin and subcutaneous tissue.  Subcuticular fat was mobilized anteriorly and posteriorly.  Then the extensor retinacular tissue was identified as well as as the distal fibula.  An incision was made through the remaining lateral ligament tissue at the tip of the fibula in a curvilinear fashion.    The remaining ligament complex was scarred and thickened.  They were in poor quality.  Then the tissue was inspected and found to be incompetent.  The distal aspect of the fibula was cleared back to a bleeding surface using a rondure and a knife.  Once adequate surface was obtained we placed an Arthrex suture tack within the distal fibula approximately 1 cm from the tip..  Then the appropriate position within the talus for the internal brace was obtained and using the guide the anchor was placed within the talus appropriately.  Then the suture tack sutures were passed through the remaining lateral ligaments incorporating the extensor retinaculum.  Then the drill hole for the internal brace was created within the fibula between approximately 1 cm just posterior to anterior at the tip of the fibula..  The ligaments were then repaired by tying down the suture anchors.  After this the internal brace was placed into the fibula to act as a check rein for the reconstruction.  This was done over the top of a hemostat to avoid over tightening.  Then the ankle was assessed for stability and found to be stable.  The wound was irrigated copiously with normal saline.  The tourniquet was released and hemostasis was obtained.  The deep tissue and subcuticular tissue was closed with 3-0 Monocryl and the skin with a 3-0 nylon.  The portals were closed with a 3-0 nylon.   The  leg was cleaned and the wounds were covered with Xeroform and a soft dressing.   A nonweightbearing short leg splint was placed.  They were awakened from anesthesia and taken recovery in stable condition.  All counts were correct at the end the case.  There was no complications.   POST OPERATIVE INSTRUCTIONS: Keep splint dry and in place Nonweightbearing to right lower extremity Call the office with concerns Follow-up in 2 weeks for splint removal, suture removal and likely placement of aboot. No need for DVT prophylaxis.   TOURNIQUET TIME: Less than two hours  BLOOD LOSS:  Minimal         DRAINS: none         SPECIMEN: none       COMPLICATIONS:  * No complications entered in OR log *         Disposition: PACU - hemodynamically stable.         Condition: stable

## 2020-11-07 NOTE — Progress Notes (Signed)
AssistedDr. Houser with left, ultrasound guided, popliteal, adductor canal block. Side rails up, monitors on throughout procedure. See vital signs in flow sheet. Tolerated Procedure well.  

## 2020-11-07 NOTE — Discharge Instructions (Signed)
[facility] POST-OP INSTRUCTIONS   Pain Management 1. The numbing medicine and your leg will last around 18 hours, take a dose of your pain medicine as soon as you feel it wearing off to avoid rebound pain. 2. Keep your foot elevated above heart level.  Make sure that your heel hangs free ('floats'). 3. Take all prescribed medication as directed. 4. If taking narcotic pain medication you may want to use an over-the-counter stool softener to avoid constipation. 5. You may take over-the-counter NSAIDs (ibuprofen, naproxen, etc.) as well as over-the-counter acetaminophen as directed on the packaging as a supplement for your pain and may also use it to wean away from the prescription medication.  Activity ? Non-weightbearing ? Keep splint intact  First Postoperative Visit 1. Your first postop visit will be at least 2 weeks after surgery.  This should be scheduled when you schedule surgery. 2. If you do not have a postoperative visit scheduled please call [phone] to schedule an appointment. 3. At the appointment your incision will be evaluated for suture removal, x-rays will be obtained if necessary.  General Instructions 1. Swelling is very common after foot and ankle surgery.  It often takes 3 months for the foot and ankle to begin to feel comfortable.  Some amount of swelling will persist for 6-12 months. 2. DO NOT change the dressing.  If there is a problem with the dressing (too tight, loose, gets wet, etc.) please contact Dr. Adair's office. 3. DO NOT get the dressing wet.  For showers you can use an over-the-counter cast cover or wrap a washcloth around the top of your dressing and then cover it with a plastic bag and tape it to your leg. 4. DO NOT soak the incision (no tubs, pools, bath, etc.) until you have approval from Dr. Adair.  Contact Dr. Adairs office or go to Emergency Room if: 1. Temperature above 101 F. 2. Increasing pain that is unresponsive to pain  medication or elevation 3. Excessive redness or swelling in your foot 4. Dressing problems - excessive bloody drainage, looseness or tightness, or if dressing gets wet 5. Develop pain, swelling, warmth, or discoloration of your calf   Post Anesthesia Home Care Instructions  Activity: Get plenty of rest for the remainder of the day. A responsible individual must stay with you for 24 hours following the procedure.  For the next 24 hours, DO NOT: -Drive a car -Operate machinery -Drink alcoholic beverages -Take any medication unless instructed by your physician -Make any legal decisions or sign important papers.  Meals: Start with liquid foods such as gelatin or soup. Progress to regular foods as tolerated. Avoid greasy, spicy, heavy foods. If nausea and/or vomiting occur, drink only clear liquids until the nausea and/or vomiting subsides. Call your physician if vomiting continues.  Special Instructions/Symptoms: Your throat may feel dry or sore from the anesthesia or the breathing tube placed in your throat during surgery. If this causes discomfort, gargle with warm salt water. The discomfort should disappear within 24 hours.  If you had a scopolamine patch placed behind your ear for the management of post- operative nausea and/or vomiting:  1. The medication in the patch is effective for 72 hours, after which it should be removed.  Wrap patch in a tissue and discard in the trash. Wash hands thoroughly with soap and water. 2. You may remove the patch earlier than 72 hours if you experience unpleasant side effects which may include dry mouth, dizziness   or visual disturbances. 3. Avoid touching the patch. Wash your hands with soap and water after contact with the patch.     Regional Anesthesia Blocks  1. Numbness or the inability to move the "blocked" extremity may last from 3-48 hours after placement. The length of time depends on the medication injected and your individual response to  the medication. If the numbness is not going away after 48 hours, call your surgeon.  2. The extremity that is blocked will need to be protected until the numbness is gone and the  Strength has returned. Because you cannot feel it, you will need to take extra care to avoid injury. Because it may be weak, you may have difficulty moving it or using it. You may not know what position it is in without looking at it while the block is in effect.  3. For blocks in the legs and feet, returning to weight bearing and walking needs to be done carefully. You will need to wait until the numbness is entirely gone and the strength has returned. You should be able to move your leg and foot normally before you try and bear weight or walk. You will need someone to be with you when you first try to ensure you do not fall and possibly risk injury.  4. Bruising and tenderness at the needle site are common side effects and will resolve in a few days.  5. Persistent numbness or new problems with movement should be communicated to the surgeon or the [facility] ([phone])/ [facility] ([phone]).  

## 2020-11-07 NOTE — Transfer of Care (Signed)
Immediate Anesthesia Transfer of Care Note  Patient: Jae Bruck  Procedure(s) Performed: LEFT ANKLE ARTHROSCOPIC DEBRIDEMENT, POSSIBLE ARTHROSCOPIC TREATMENT OF TALUS OSTEOCHONDRAL LESION, LATERAL LIGAMENT RECONSTRUCTION, POSSIBLE DELTOID LIGAMENT RECONSTRUCTION (Left Ankle)  Patient Location: PACU  Anesthesia Type:General and Regional  Level of Consciousness: awake and patient cooperative  Airway & Oxygen Therapy: Patient Spontanous Breathing and Patient connected to face mask oxygen  Post-op Assessment: Report given to RN, Post -op Vital signs reviewed and stable, Patient moving all extremities X 4 and Patient able to stick tongue midline  Post vital signs: Reviewed and stable  Last Vitals:  Vitals Value Taken Time  BP 102/52 11/07/20 1121  Temp    Pulse 58 11/07/20 1123  Resp 16 11/07/20 1123  SpO2 100 % 11/07/20 1123  Vitals shown include unvalidated device data.  Last Pain:  Vitals:   11/07/20 0759  TempSrc: Oral  PainSc: 0-No pain         Complications: No complications documented.

## 2020-11-07 NOTE — H&P (Signed)
PREOPERATIVE H&P  Chief Complaint: Left ankle pain  HPI: Amanda Hester is a 19 y.o. female who presents for preoperative history and physical with a diagnosis of left ankle instability and impingement.  She had an injury during a soccer game at college where she had complete inversion injury with 90 degree deformity through her ankle.  Subsequent x-rays did not reveal a fracture.  Subsequent MRI scan revealed disruption of the lateral ligaments and evidence of synovitis within the ankle joint.  There is contusion to the medial malleolus and to the medial talus consistent with the injury.  She failed conservative treatment the form of immobilization, anti-inflammatories and physical therapy and was indicated for surgery given the amount of instability and pain that she continues to have.  She is here today for surgical correction.. Symptoms are rated as moderate to severe, and have been worsening.  This is significantly impairing activities of daily living.  She has elected for surgical management.   Past Medical History:  Diagnosis Date  . Asthma    sports induced  . Dyspepsia 08/22/2018  . GERD (gastroesophageal reflux disease)    History reviewed. No pertinent surgical history. Social History   Socioeconomic History  . Marital status: Single    Spouse name: Not on file  . Number of children: Not on file  . Years of education: Not on file  . Highest education level: Not on file  Occupational History  . Not on file  Tobacco Use  . Smoking status: Never Smoker  . Smokeless tobacco: Never Used  Vaping Use  . Vaping Use: Never used  Substance and Sexual Activity  . Alcohol use: Not Currently  . Drug use: Never  . Sexual activity: Not on file  Other Topics Concern  . Not on file  Social History Narrative   11 th grade at [employer]   Social Determinants of Health   Financial Resource Strain: Not on file  Food Insecurity: Not on file  Transportation Needs: Not on file   Physical Activity: Not on file  Stress: Not on file  Social Connections: Not on file   Family History  Problem Relation Age of Onset  . Cancer Maternal Grandmother   . GI problems Neg Hx    No Known Allergies Prior to Admission medications   Medication Sig Start Date End Date Taking? Authorizing Provider  adapalene (DIFFERIN) 0.1 % gel APPLY ON THE SKIN AT BEDTIME 07/27/18  Yes [provider]  fexofenadine (ALLEGRA) 60 MG tablet Take 60 mg by mouth 2 (two) times daily.   Yes [provider]  ibuprofen (ADVIL,MOTRIN) 200 MG tablet Take 200 mg by mouth every 6 (six) hours as needed. For foot pain   Yes [provider]  omeprazole (PRILOSEC) 40 MG capsule Take 1 capsule (40 mg total) by mouth 2 (two) times daily. 08/22/18  Yes Mir, Shirlyn Goltz, MD  albuterol (PROVENTIL HFA;VENTOLIN HFA) 108 (90 Base) MCG/ACT inhaler Inhale into the lungs. 10/07/17   [provider]  Doxycycline Hyclate 75 MG TABS Take 1 tablet by mouth daily. 06/01/18   [provider]     Positive ROS: All other systems have been reviewed and were otherwise negative with the exception of those mentioned in the HPI and as above.  Physical Exam:  Vitals:   11/07/20 0855 11/07/20 0900  BP: (!) 98/55 (!) 103/59  Pulse: (!) 47 (!) 49  Resp: 11 12  SpO2: 100% 100%   General: Alert, no acute distress  Cardiovascular: No pedal edema Respiratory: No cyanosis, no use of accessory musculature GI: No organomegaly, abdomen is soft and non-tender Skin: No lesions in the area of chief complaint Neurologic: Sensation intact distally Psychiatric: Patient is competent for consent with normal mood and affect Lymphatic: No axillary or cervical lymphadenopathy  MUSCULOSKELETAL: Left ankle demonstrates swelling laterally.  She has tenderness palpation along the lateral ankle.  She has gross instability noted with talar tilting and with anterior drawer testing.  She has tenderness medially  along the medial malleolus and medial talus and deltoid complex.  Difficult to determine medial instability.  No significant tenderness over the syndesmosis.  Active motion intact with good strength.  Foot is warm and well-perfused.  Assessment: Left ankle instability and impingement   Plan: Plan for arthroscopic ankle debridement and treatment of osteochondral lesion as needed.  We will also plan for lateral ligament reconstruction possible deltoid ligament reconstruction based on instability pattern seen during surgery.  We discussed the risks, benefits and alternatives of surgery which include but are not limited to wound healing complications, infection, nonunion, malunion, need for further surgery, damage to surrounding structures and continued pain.  They understand there is no guarantees to an acceptable outcome.  After weighing these risks they opted to proceed with surgery.     Terance Hart, MD    11/07/2020 9:24 AM

## 2020-11-11 ENCOUNTER — Encounter (HOSPITAL_BASED_OUTPATIENT_CLINIC_OR_DEPARTMENT_OTHER): Payer: Self-pay | Admitting: Orthopaedic Surgery

## 2020-11-11 NOTE — Addendum Note (Signed)
Addendum  created 11/11/20 0805 by Kyair Ditommaso, Jewel Baize, CRNA   Charge Capture section accepted

## 2020-12-20 ENCOUNTER — Other Ambulatory Visit (HOSPITAL_COMMUNITY): Payer: Self-pay | Admitting: Physician Assistant

## 2020-12-20 DIAGNOSIS — L218 Other seborrheic dermatitis: Secondary | ICD-10-CM | POA: Diagnosis not present

## 2020-12-20 DIAGNOSIS — L810 Postinflammatory hyperpigmentation: Secondary | ICD-10-CM | POA: Diagnosis not present

## 2020-12-20 DIAGNOSIS — L700 Acne vulgaris: Secondary | ICD-10-CM | POA: Diagnosis not present

## 2020-12-20 MED FILL — DOXYCYCLINE HYCLATE 100 MG: 100 | 30 days supply | Qty: 60 | Fill #0

## 2020-12-20 MED FILL — TRI-LO-SPRINTEC TABLET: 0.18/0.215/ | 84 days supply | Qty: 84 | Fill #0

## 2020-12-20 MED FILL — TRETINOIN 0.025% CREAM: 0.025 | 30 days supply | Qty: 45 | Fill #0

## 2020-12-20 MED FILL — SOD SULFACET-SULFUR 10-5% C: 10-5 | 30 days supply | Qty: 170 | Fill #0

## 2020-12-20 MED FILL — metroNIDAZOLE 0.75 % CREA: 0.75 | 30 days supply | Qty: 45 | Fill #0

## 2020-12-20 MED FILL — ONEXTON GEL PUMP: 1.2-3.75 | 30 days supply | Qty: 50 | Fill #0

## 2020-12-27 DIAGNOSIS — M24272 Disorder of ligament, left ankle: Secondary | ICD-10-CM | POA: Diagnosis not present

## 2020-12-27 DIAGNOSIS — M25572 Pain in left ankle and joints of left foot: Secondary | ICD-10-CM | POA: Diagnosis not present

## 2020-12-27 DIAGNOSIS — R531 Weakness: Secondary | ICD-10-CM | POA: Diagnosis not present

## 2020-12-31 DIAGNOSIS — R531 Weakness: Secondary | ICD-10-CM | POA: Diagnosis not present

## 2020-12-31 DIAGNOSIS — M25572 Pain in left ankle and joints of left foot: Secondary | ICD-10-CM | POA: Diagnosis not present

## 2020-12-31 DIAGNOSIS — M24272 Disorder of ligament, left ankle: Secondary | ICD-10-CM | POA: Diagnosis not present

## 2021-01-03 DIAGNOSIS — R531 Weakness: Secondary | ICD-10-CM | POA: Diagnosis not present

## 2021-01-03 DIAGNOSIS — M24272 Disorder of ligament, left ankle: Secondary | ICD-10-CM | POA: Diagnosis not present

## 2021-01-03 DIAGNOSIS — M25572 Pain in left ankle and joints of left foot: Secondary | ICD-10-CM | POA: Diagnosis not present

## 2021-01-07 DIAGNOSIS — M25572 Pain in left ankle and joints of left foot: Secondary | ICD-10-CM | POA: Diagnosis not present

## 2021-01-07 DIAGNOSIS — R531 Weakness: Secondary | ICD-10-CM | POA: Diagnosis not present

## 2021-01-07 DIAGNOSIS — M24272 Disorder of ligament, left ankle: Secondary | ICD-10-CM | POA: Diagnosis not present

## 2021-01-21 DIAGNOSIS — R531 Weakness: Secondary | ICD-10-CM | POA: Diagnosis not present

## 2021-01-21 DIAGNOSIS — M25572 Pain in left ankle and joints of left foot: Secondary | ICD-10-CM | POA: Diagnosis not present

## 2021-01-21 DIAGNOSIS — M24272 Disorder of ligament, left ankle: Secondary | ICD-10-CM | POA: Diagnosis not present

## 2021-01-23 DIAGNOSIS — M24272 Disorder of ligament, left ankle: Secondary | ICD-10-CM | POA: Diagnosis not present

## 2021-01-23 DIAGNOSIS — M25572 Pain in left ankle and joints of left foot: Secondary | ICD-10-CM | POA: Diagnosis not present

## 2021-01-23 DIAGNOSIS — R531 Weakness: Secondary | ICD-10-CM | POA: Diagnosis not present

## 2021-01-28 DIAGNOSIS — M24272 Disorder of ligament, left ankle: Secondary | ICD-10-CM | POA: Diagnosis not present

## 2021-01-28 DIAGNOSIS — R531 Weakness: Secondary | ICD-10-CM | POA: Diagnosis not present

## 2021-01-28 DIAGNOSIS — M25572 Pain in left ankle and joints of left foot: Secondary | ICD-10-CM | POA: Diagnosis not present

## 2021-01-30 DIAGNOSIS — M25572 Pain in left ankle and joints of left foot: Secondary | ICD-10-CM | POA: Diagnosis not present

## 2021-01-30 DIAGNOSIS — R531 Weakness: Secondary | ICD-10-CM | POA: Diagnosis not present

## 2021-01-30 DIAGNOSIS — M24272 Disorder of ligament, left ankle: Secondary | ICD-10-CM | POA: Diagnosis not present

## 2021-02-04 DIAGNOSIS — M24272 Disorder of ligament, left ankle: Secondary | ICD-10-CM | POA: Diagnosis not present

## 2021-02-04 DIAGNOSIS — M25572 Pain in left ankle and joints of left foot: Secondary | ICD-10-CM | POA: Diagnosis not present

## 2021-02-04 DIAGNOSIS — R531 Weakness: Secondary | ICD-10-CM | POA: Diagnosis not present

## 2021-02-06 DIAGNOSIS — M25572 Pain in left ankle and joints of left foot: Secondary | ICD-10-CM | POA: Diagnosis not present

## 2021-02-06 DIAGNOSIS — M24272 Disorder of ligament, left ankle: Secondary | ICD-10-CM | POA: Diagnosis not present

## 2021-02-06 DIAGNOSIS — R531 Weakness: Secondary | ICD-10-CM | POA: Diagnosis not present

## 2021-02-11 DIAGNOSIS — M24272 Disorder of ligament, left ankle: Secondary | ICD-10-CM | POA: Diagnosis not present

## 2021-02-11 DIAGNOSIS — R531 Weakness: Secondary | ICD-10-CM | POA: Diagnosis not present

## 2021-02-11 DIAGNOSIS — M25572 Pain in left ankle and joints of left foot: Secondary | ICD-10-CM | POA: Diagnosis not present

## 2021-02-13 DIAGNOSIS — M24272 Disorder of ligament, left ankle: Secondary | ICD-10-CM | POA: Diagnosis not present

## 2021-02-13 DIAGNOSIS — M25572 Pain in left ankle and joints of left foot: Secondary | ICD-10-CM | POA: Diagnosis not present

## 2021-02-13 DIAGNOSIS — R531 Weakness: Secondary | ICD-10-CM | POA: Diagnosis not present

## 2021-02-17 DIAGNOSIS — M25572 Pain in left ankle and joints of left foot: Secondary | ICD-10-CM | POA: Diagnosis not present

## 2021-02-17 DIAGNOSIS — R531 Weakness: Secondary | ICD-10-CM | POA: Diagnosis not present

## 2021-02-17 DIAGNOSIS — M24272 Disorder of ligament, left ankle: Secondary | ICD-10-CM | POA: Diagnosis not present

## 2021-02-18 ENCOUNTER — Other Ambulatory Visit (HOSPITAL_COMMUNITY): Payer: Self-pay

## 2021-02-18 MED FILL — Norgestimate-Eth Estrad Tab 0.18-25/0.215-25/0.25-25 MG-MCG: ORAL | 84 days supply | Qty: 84 | Fill #0 | Status: CN

## 2021-02-18 MED FILL — Doxycycline Hyclate Cap 100 MG: ORAL | 30 days supply | Qty: 60 | Fill #0 | Status: AC

## 2021-02-18 MED FILL — Tretinoin Cream 0.025%: CUTANEOUS | 30 days supply | Qty: 45 | Fill #0 | Status: AC

## 2021-02-18 MED FILL — Sulfacetamide Sodium w/ Sulfur Cleanser 10-5%: CUTANEOUS | 30 days supply | Qty: 170 | Fill #0 | Status: AC

## 2021-02-25 DIAGNOSIS — M24272 Disorder of ligament, left ankle: Secondary | ICD-10-CM | POA: Diagnosis not present

## 2021-02-25 DIAGNOSIS — R531 Weakness: Secondary | ICD-10-CM | POA: Diagnosis not present

## 2021-02-25 DIAGNOSIS — M25572 Pain in left ankle and joints of left foot: Secondary | ICD-10-CM | POA: Diagnosis not present

## 2021-02-27 DIAGNOSIS — R531 Weakness: Secondary | ICD-10-CM | POA: Diagnosis not present

## 2021-02-27 DIAGNOSIS — M25572 Pain in left ankle and joints of left foot: Secondary | ICD-10-CM | POA: Diagnosis not present

## 2021-02-27 DIAGNOSIS — M24272 Disorder of ligament, left ankle: Secondary | ICD-10-CM | POA: Diagnosis not present

## 2021-03-04 DIAGNOSIS — M24272 Disorder of ligament, left ankle: Secondary | ICD-10-CM | POA: Diagnosis not present

## 2021-03-04 DIAGNOSIS — M25572 Pain in left ankle and joints of left foot: Secondary | ICD-10-CM | POA: Diagnosis not present

## 2021-03-04 DIAGNOSIS — R531 Weakness: Secondary | ICD-10-CM | POA: Diagnosis not present

## 2021-03-06 DIAGNOSIS — M24272 Disorder of ligament, left ankle: Secondary | ICD-10-CM | POA: Diagnosis not present

## 2021-03-06 DIAGNOSIS — R531 Weakness: Secondary | ICD-10-CM | POA: Diagnosis not present

## 2021-03-06 DIAGNOSIS — M25572 Pain in left ankle and joints of left foot: Secondary | ICD-10-CM | POA: Diagnosis not present

## 2021-03-11 DIAGNOSIS — R531 Weakness: Secondary | ICD-10-CM | POA: Diagnosis not present

## 2021-03-11 DIAGNOSIS — M25572 Pain in left ankle and joints of left foot: Secondary | ICD-10-CM | POA: Diagnosis not present

## 2021-03-11 DIAGNOSIS — M24272 Disorder of ligament, left ankle: Secondary | ICD-10-CM | POA: Diagnosis not present

## 2021-03-12 DIAGNOSIS — M24272 Disorder of ligament, left ankle: Secondary | ICD-10-CM | POA: Diagnosis not present

## 2021-03-12 DIAGNOSIS — R531 Weakness: Secondary | ICD-10-CM | POA: Diagnosis not present

## 2021-03-12 DIAGNOSIS — M25572 Pain in left ankle and joints of left foot: Secondary | ICD-10-CM | POA: Diagnosis not present

## 2021-03-13 DIAGNOSIS — M25572 Pain in left ankle and joints of left foot: Secondary | ICD-10-CM | POA: Diagnosis not present

## 2021-03-13 DIAGNOSIS — R531 Weakness: Secondary | ICD-10-CM | POA: Diagnosis not present

## 2021-03-13 DIAGNOSIS — M24272 Disorder of ligament, left ankle: Secondary | ICD-10-CM | POA: Diagnosis not present

## 2021-03-18 DIAGNOSIS — M25672 Stiffness of left ankle, not elsewhere classified: Secondary | ICD-10-CM | POA: Diagnosis not present

## 2021-03-20 DIAGNOSIS — M25672 Stiffness of left ankle, not elsewhere classified: Secondary | ICD-10-CM | POA: Diagnosis not present

## 2021-03-25 DIAGNOSIS — M79672 Pain in left foot: Secondary | ICD-10-CM | POA: Diagnosis not present

## 2021-03-25 DIAGNOSIS — M25672 Stiffness of left ankle, not elsewhere classified: Secondary | ICD-10-CM | POA: Diagnosis not present

## 2021-03-27 DIAGNOSIS — M79672 Pain in left foot: Secondary | ICD-10-CM | POA: Diagnosis not present

## 2021-03-27 DIAGNOSIS — M25672 Stiffness of left ankle, not elsewhere classified: Secondary | ICD-10-CM | POA: Diagnosis not present

## 2021-04-01 DIAGNOSIS — M25579 Pain in unspecified ankle and joints of unspecified foot: Secondary | ICD-10-CM | POA: Diagnosis not present

## 2021-04-01 DIAGNOSIS — M25672 Stiffness of left ankle, not elsewhere classified: Secondary | ICD-10-CM | POA: Diagnosis not present

## 2021-04-03 DIAGNOSIS — M25579 Pain in unspecified ankle and joints of unspecified foot: Secondary | ICD-10-CM | POA: Diagnosis not present

## 2021-04-03 DIAGNOSIS — M25572 Pain in left ankle and joints of left foot: Secondary | ICD-10-CM | POA: Diagnosis not present

## 2021-04-03 DIAGNOSIS — M25672 Stiffness of left ankle, not elsewhere classified: Secondary | ICD-10-CM | POA: Diagnosis not present

## 2021-04-10 DIAGNOSIS — M25672 Stiffness of left ankle, not elsewhere classified: Secondary | ICD-10-CM | POA: Diagnosis not present

## 2021-04-10 DIAGNOSIS — M25579 Pain in unspecified ankle and joints of unspecified foot: Secondary | ICD-10-CM | POA: Diagnosis not present

## 2021-04-30 DIAGNOSIS — M25579 Pain in unspecified ankle and joints of unspecified foot: Secondary | ICD-10-CM | POA: Diagnosis not present

## 2021-05-02 ENCOUNTER — Other Ambulatory Visit (HOSPITAL_COMMUNITY): Payer: Self-pay

## 2021-05-02 MED FILL — Clindamycin Phosphate-Benzoyl Peroxide Gel 1.2-3.75%: CUTANEOUS | 30 days supply | Qty: 50 | Fill #0 | Status: AC

## 2021-05-02 MED FILL — Doxycycline Hyclate Cap 100 MG: ORAL | 30 days supply | Qty: 60 | Fill #1 | Status: AC

## 2021-05-05 ENCOUNTER — Other Ambulatory Visit (HOSPITAL_COMMUNITY): Payer: Self-pay

## 2021-05-06 ENCOUNTER — Other Ambulatory Visit (HOSPITAL_COMMUNITY): Payer: Self-pay

## 2021-05-06 MED FILL — Tretinoin Cream 0.025%: CUTANEOUS | 30 days supply | Qty: 45 | Fill #1 | Status: AC

## 2021-05-06 MED FILL — Metronidazole Cream 0.75%: CUTANEOUS | 30 days supply | Qty: 45 | Fill #0 | Status: AC

## 2021-05-22 ENCOUNTER — Other Ambulatory Visit (HOSPITAL_COMMUNITY): Payer: Self-pay

## 2021-05-22 DIAGNOSIS — J301 Allergic rhinitis due to pollen: Secondary | ICD-10-CM | POA: Diagnosis not present

## 2021-05-22 DIAGNOSIS — J45990 Exercise induced bronchospasm: Secondary | ICD-10-CM | POA: Diagnosis not present

## 2021-05-22 MED ORDER — ALBUTEROL SULFATE HFA 108 (90 BASE) MCG/ACT IN AERS
INHALATION_SPRAY | RESPIRATORY_TRACT | 5 refills | Status: AC
  Filled 2021-05-22 – 2021-06-03 (×2): qty 8.5, 17d supply, fill #0

## 2021-05-30 ENCOUNTER — Other Ambulatory Visit (HOSPITAL_COMMUNITY): Payer: Self-pay

## 2021-06-03 ENCOUNTER — Other Ambulatory Visit (HOSPITAL_COMMUNITY): Payer: Self-pay

## 2021-07-24 ENCOUNTER — Other Ambulatory Visit (HOSPITAL_COMMUNITY): Payer: Self-pay

## 2021-07-24 MED FILL — Sulfacetamide Sodium w/ Sulfur Cleanser 10-5%: CUTANEOUS | 30 days supply | Qty: 170 | Fill #1 | Status: AC

## 2021-07-24 MED FILL — Norgestimate-Eth Estrad Tab 0.18-25/0.215-25/0.25-25 MG-MCG: ORAL | 84 days supply | Qty: 84 | Fill #0 | Status: AC

## 2021-07-24 MED FILL — Clindamycin Phosphate-Benzoyl Peroxide Gel 1.2-3.75%: CUTANEOUS | 30 days supply | Qty: 50 | Fill #1 | Status: AC

## 2021-07-25 ENCOUNTER — Other Ambulatory Visit (HOSPITAL_COMMUNITY): Payer: Self-pay

## 2021-07-28 ENCOUNTER — Other Ambulatory Visit (HOSPITAL_COMMUNITY): Payer: Self-pay

## 2021-07-29 ENCOUNTER — Other Ambulatory Visit (HOSPITAL_COMMUNITY): Payer: Self-pay

## 2021-08-06 ENCOUNTER — Other Ambulatory Visit (HOSPITAL_COMMUNITY): Payer: Self-pay

## 2021-08-08 ENCOUNTER — Other Ambulatory Visit (HOSPITAL_COMMUNITY): Payer: Self-pay

## 2021-08-20 ENCOUNTER — Other Ambulatory Visit (HOSPITAL_COMMUNITY): Payer: Self-pay

## 2021-10-29 DIAGNOSIS — E509 Vitamin A deficiency, unspecified: Secondary | ICD-10-CM | POA: Diagnosis not present

## 2021-10-29 DIAGNOSIS — E8881 Metabolic syndrome: Secondary | ICD-10-CM | POA: Diagnosis not present

## 2021-10-29 DIAGNOSIS — E279 Disorder of adrenal gland, unspecified: Secondary | ICD-10-CM | POA: Diagnosis not present

## 2021-10-29 DIAGNOSIS — E063 Autoimmune thyroiditis: Secondary | ICD-10-CM | POA: Diagnosis not present

## 2021-10-29 DIAGNOSIS — E7212 Methylenetetrahydrofolate reductase deficiency: Secondary | ICD-10-CM | POA: Diagnosis not present

## 2021-10-29 DIAGNOSIS — E039 Hypothyroidism, unspecified: Secondary | ICD-10-CM | POA: Diagnosis not present

## 2021-10-29 DIAGNOSIS — R5383 Other fatigue: Secondary | ICD-10-CM | POA: Diagnosis not present

## 2021-10-29 DIAGNOSIS — E559 Vitamin D deficiency, unspecified: Secondary | ICD-10-CM | POA: Diagnosis not present

## 2021-10-29 DIAGNOSIS — R7982 Elevated C-reactive protein (CRP): Secondary | ICD-10-CM | POA: Diagnosis not present

## 2021-10-29 DIAGNOSIS — Z7689 Persons encountering health services in other specified circumstances: Secondary | ICD-10-CM | POA: Diagnosis not present

## 2021-11-12 ENCOUNTER — Other Ambulatory Visit (HOSPITAL_COMMUNITY): Payer: Self-pay

## 2021-11-12 MED ORDER — FAMOTIDINE 40 MG PO TABS
40.0000 mg | ORAL_TABLET | Freq: Every day | ORAL | 0 refills | Status: AC
  Filled 2021-11-12: qty 90, 90d supply, fill #0

## 2021-11-13 ENCOUNTER — Other Ambulatory Visit (HOSPITAL_COMMUNITY): Payer: Self-pay

## 2021-11-13 DIAGNOSIS — L700 Acne vulgaris: Secondary | ICD-10-CM | POA: Diagnosis not present

## 2021-11-13 MED ORDER — NORETHINDRONE ACET-ETHINYL EST 1-20 MG-MCG PO TABS
1.0000 | ORAL_TABLET | Freq: Every day | ORAL | 3 refills | Status: DC
  Filled 2021-11-13: qty 21, 21d supply, fill #0
  Filled 2021-12-22: qty 63, 84d supply, fill #0

## 2021-11-13 MED ORDER — TRETINOIN 0.025 % EX CREA
1.0000 "application " | TOPICAL_CREAM | Freq: Every evening | CUTANEOUS | 3 refills | Status: DC
  Filled 2021-11-13: qty 45, 30d supply, fill #0
  Filled 2022-05-08: qty 45, 30d supply, fill #1
  Filled 2022-10-07: qty 45, 30d supply, fill #2

## 2021-12-22 ENCOUNTER — Other Ambulatory Visit (HOSPITAL_COMMUNITY): Payer: Self-pay

## 2022-02-13 ENCOUNTER — Other Ambulatory Visit (HOSPITAL_COMMUNITY): Payer: Self-pay

## 2022-02-16 ENCOUNTER — Other Ambulatory Visit (HOSPITAL_COMMUNITY): Payer: Self-pay

## 2022-02-17 ENCOUNTER — Other Ambulatory Visit (HOSPITAL_COMMUNITY): Payer: Self-pay

## 2022-02-18 ENCOUNTER — Other Ambulatory Visit (HOSPITAL_COMMUNITY): Payer: Self-pay

## 2022-02-19 ENCOUNTER — Other Ambulatory Visit (HOSPITAL_COMMUNITY): Payer: Self-pay

## 2022-02-19 MED ORDER — NORETHINDRONE ACET-ETHINYL EST 1-20 MG-MCG PO TABS
1.0000 | ORAL_TABLET | Freq: Every day | ORAL | 8 refills | Status: DC
  Filled 2022-02-19: qty 21, 21d supply, fill #0
  Filled 2022-05-08: qty 63, 84d supply, fill #0

## 2022-02-20 ENCOUNTER — Other Ambulatory Visit (HOSPITAL_COMMUNITY): Payer: Self-pay

## 2022-02-23 ENCOUNTER — Other Ambulatory Visit (HOSPITAL_COMMUNITY): Payer: Self-pay

## 2022-02-24 ENCOUNTER — Other Ambulatory Visit (HOSPITAL_COMMUNITY): Payer: Self-pay

## 2022-02-24 MED ORDER — ONEXTON 1.2-3.75 % EX GEL
CUTANEOUS | 1 refills | Status: DC
  Filled 2022-02-24 – 2022-03-02 (×2): qty 50, 30d supply, fill #0
  Filled 2022-10-07: qty 50, 30d supply, fill #1

## 2022-02-24 MED ORDER — NORETHINDRONE ACET-ETHINYL EST 1-20 MG-MCG PO TABS
1.0000 | ORAL_TABLET | Freq: Every day | ORAL | 8 refills | Status: AC
  Filled 2022-02-24: qty 63, 84d supply, fill #0
  Filled 2022-04-09: qty 21, 21d supply, fill #1
  Filled 2022-10-07: qty 63, 84d supply, fill #1
  Filled 2022-11-11 (×2): qty 21, 21d supply, fill #2
  Filled 2022-11-11: qty 63, 84d supply, fill #2
  Filled 2023-01-28: qty 21, 21d supply, fill #3

## 2022-02-25 ENCOUNTER — Other Ambulatory Visit (HOSPITAL_COMMUNITY): Payer: Self-pay

## 2022-02-27 ENCOUNTER — Other Ambulatory Visit (HOSPITAL_COMMUNITY): Payer: Self-pay

## 2022-03-02 ENCOUNTER — Other Ambulatory Visit (HOSPITAL_COMMUNITY): Payer: Self-pay

## 2022-03-03 ENCOUNTER — Other Ambulatory Visit (HOSPITAL_COMMUNITY): Payer: Self-pay

## 2022-03-24 ENCOUNTER — Other Ambulatory Visit (HOSPITAL_COMMUNITY): Payer: Self-pay

## 2022-03-24 DIAGNOSIS — Z79899 Other long term (current) drug therapy: Secondary | ICD-10-CM | POA: Diagnosis not present

## 2022-03-24 DIAGNOSIS — L700 Acne vulgaris: Secondary | ICD-10-CM | POA: Diagnosis not present

## 2022-03-24 MED ORDER — CLINDAMYCIN PHOSPHATE 1 % EX SWAB
Freq: Every day | CUTANEOUS | 2 refills | Status: DC
Start: 2022-03-24 — End: 2023-03-01
  Filled 2022-03-24: qty 60, 30d supply, fill #0
  Filled 2022-05-26: qty 60, 30d supply, fill #1

## 2022-03-24 MED ORDER — TRETINOIN 0.05 % EX CREA
TOPICAL_CREAM | CUTANEOUS | 5 refills | Status: DC
  Filled 2022-03-24: qty 45, 30d supply, fill #0
  Filled 2023-03-01: qty 135, 90d supply, fill #1

## 2022-03-27 ENCOUNTER — Other Ambulatory Visit (HOSPITAL_COMMUNITY): Payer: Self-pay

## 2022-04-09 ENCOUNTER — Other Ambulatory Visit (HOSPITAL_COMMUNITY): Payer: Self-pay

## 2022-04-09 DIAGNOSIS — Z79899 Other long term (current) drug therapy: Secondary | ICD-10-CM | POA: Diagnosis not present

## 2022-04-09 DIAGNOSIS — L700 Acne vulgaris: Secondary | ICD-10-CM | POA: Diagnosis not present

## 2022-04-21 ENCOUNTER — Other Ambulatory Visit (HOSPITAL_COMMUNITY): Payer: Self-pay

## 2022-04-21 MED ORDER — SPIRONOLACTONE 50 MG PO TABS
ORAL_TABLET | ORAL | 3 refills | Status: DC
  Filled 2022-04-21: qty 60, 30d supply, fill #0
  Filled 2022-05-26: qty 60, 30d supply, fill #1
  Filled 2022-07-06: qty 60, 30d supply, fill #2
  Filled 2022-08-03: qty 60, 30d supply, fill #3

## 2022-05-08 ENCOUNTER — Other Ambulatory Visit (HOSPITAL_COMMUNITY): Payer: Self-pay

## 2022-05-15 ENCOUNTER — Other Ambulatory Visit (HOSPITAL_COMMUNITY): Payer: Self-pay

## 2022-05-26 ENCOUNTER — Other Ambulatory Visit (HOSPITAL_COMMUNITY): Payer: Self-pay

## 2022-05-26 MED ORDER — ONEXTON 1.2-3.75 % EX GEL
1.0000 | Freq: Every day | CUTANEOUS | 1 refills | Status: AC
  Filled 2022-05-26: qty 50, 30d supply, fill #0
  Filled 2023-03-01: qty 50, 30d supply, fill #1

## 2022-05-26 MED ORDER — METRONIDAZOLE 0.75 % EX CREA
1.0000 | TOPICAL_CREAM | Freq: Every morning | CUTANEOUS | 2 refills | Status: AC
Start: 2022-05-26 — End: ?
  Filled 2022-05-26: qty 45, 30d supply, fill #0
  Filled 2022-10-07: qty 45, 30d supply, fill #1
  Filled 2023-01-28: qty 45, 30d supply, fill #2

## 2022-05-29 ENCOUNTER — Other Ambulatory Visit (HOSPITAL_COMMUNITY): Payer: Self-pay

## 2022-06-01 ENCOUNTER — Other Ambulatory Visit (HOSPITAL_COMMUNITY): Payer: Self-pay

## 2022-06-01 MED ORDER — ONEXTON 1.2-3.75 % EX GEL
1.0000 "application " | Freq: Every day | CUTANEOUS | 1 refills | Status: AC
  Filled 2022-06-01: qty 50, 30d supply, fill #0

## 2022-07-06 ENCOUNTER — Other Ambulatory Visit (HOSPITAL_COMMUNITY): Payer: Self-pay

## 2022-07-11 IMAGING — CT CT ANKLE*L* W/O CM
4 of 6 series · 14 of 33 positions shown, 16 images · non-contrast
Comparison: MRI left ankle 03/28/2018.

CLINICAL DATA: Left ankle instability since an injury playing
soccer in August 2020.

EXAM:
CT OF THE LEFT ANKLE WITHOUT CONTRAST
TECHNIQUE: Multidetector CT imaging of the left ankle was performed according
to the standard protocol. Multiplanar CT image reconstructions were
also generated.

[Series 5: axial bone · axial · 0.38mm/px · z∈[-1376,-1291]mm · 5 of 87 slices shown, 7 images]
[im 15/87  soft-tissue]
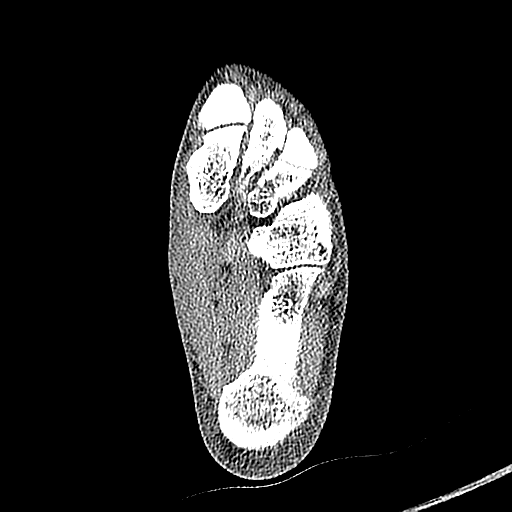
[im 15/87  bone]
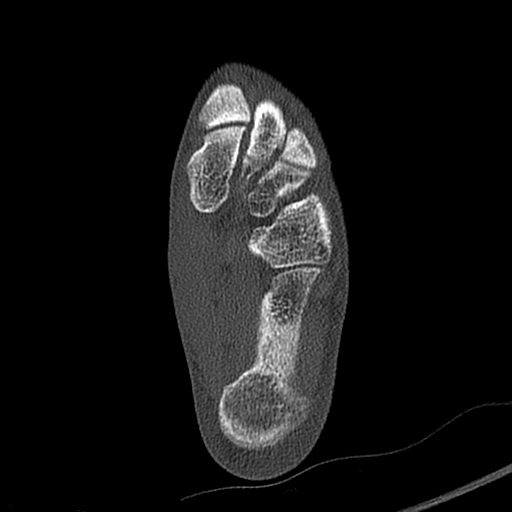
[im 29/87  bone]
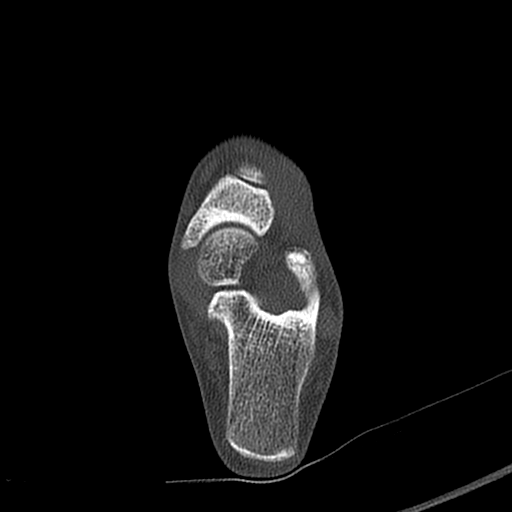
[im 44/87  bone]
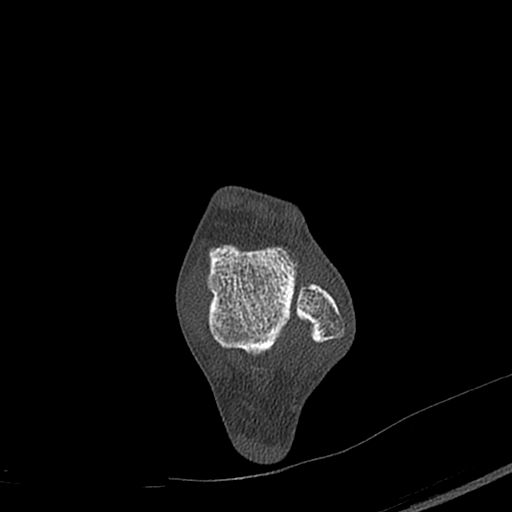
[im 58/87  bone]
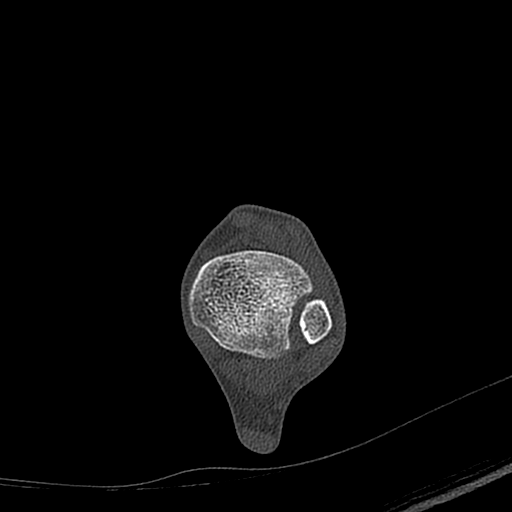
[im 72/87  soft-tissue]
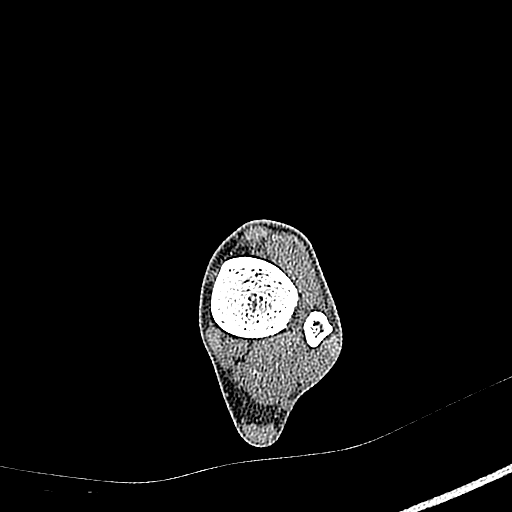
[im 72/87  bone]
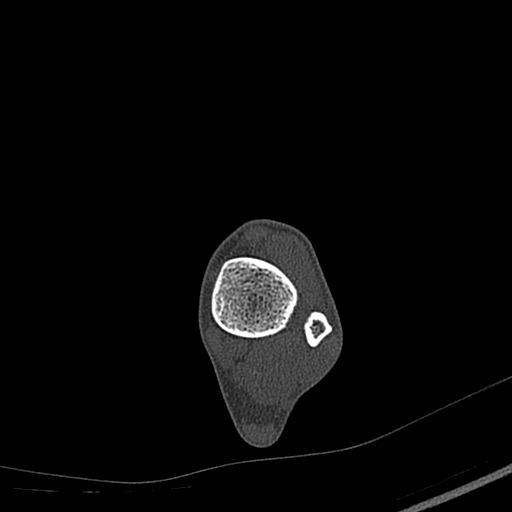

[Series 6: coronal bone · coronal · 0.31mm/px · 1 of 109 slices shown]
[im 55/109  bone]
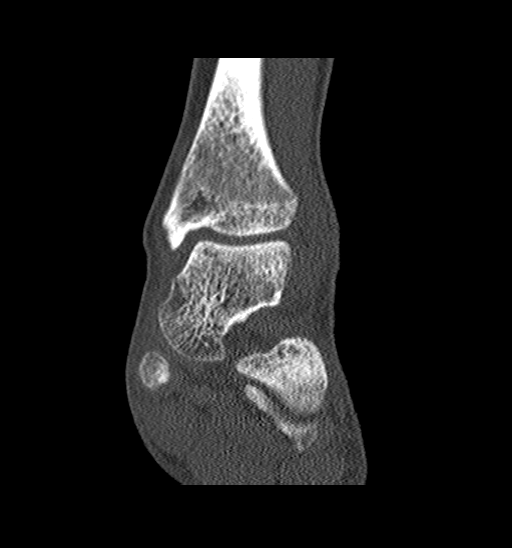

[Series 8: axial st · axial · 0.38mm/px · z∈[-1376,-1333]mm · 3 of 87 slices shown]
[im 15/87  bone]
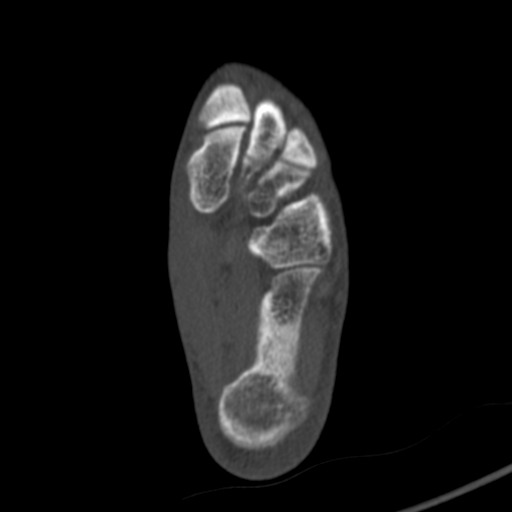
[im 29/87  bone]
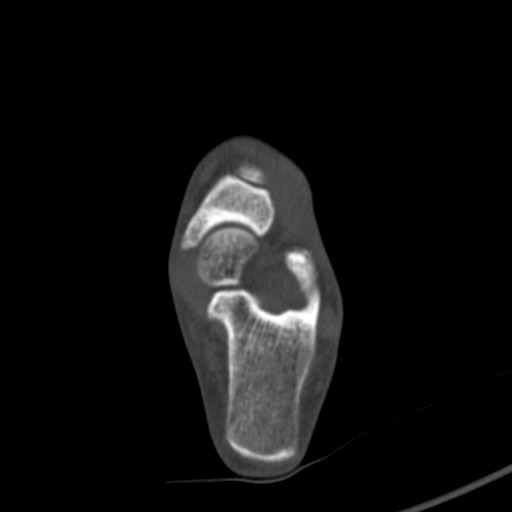
[im 44/87  bone]
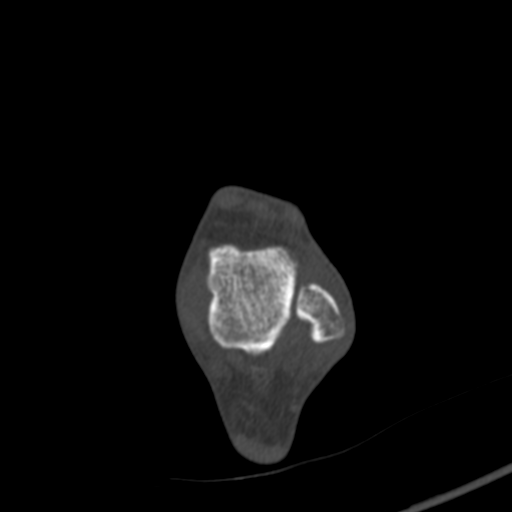

[Series 10: sagittal st · sagittal · 0.30mm/px · 5 of 59 slices shown]
[im 10/59  bone]
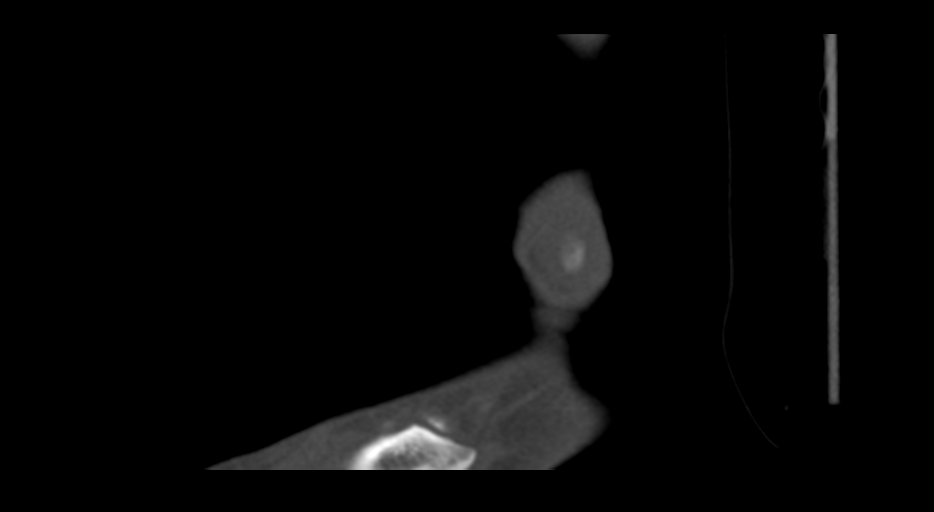
[im 20/59  bone]
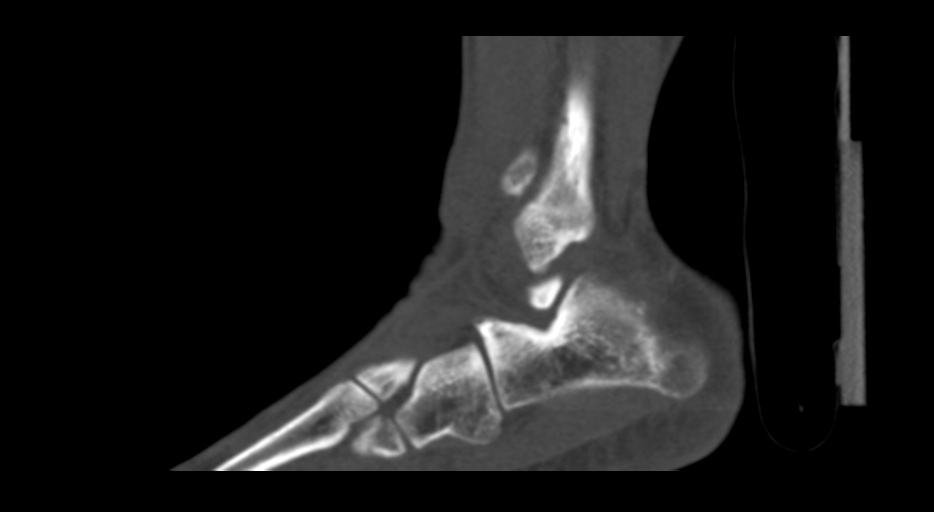
[im 30/59  bone]
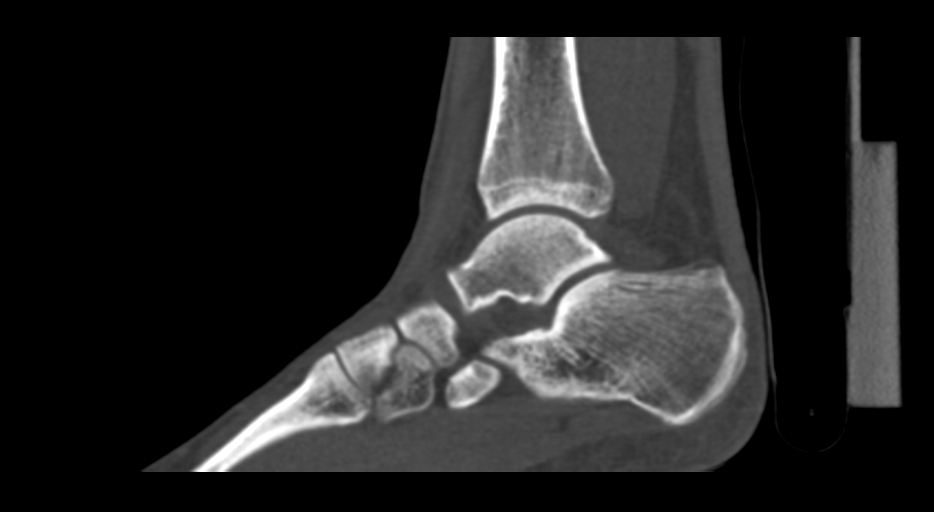
[im 39/59  bone]
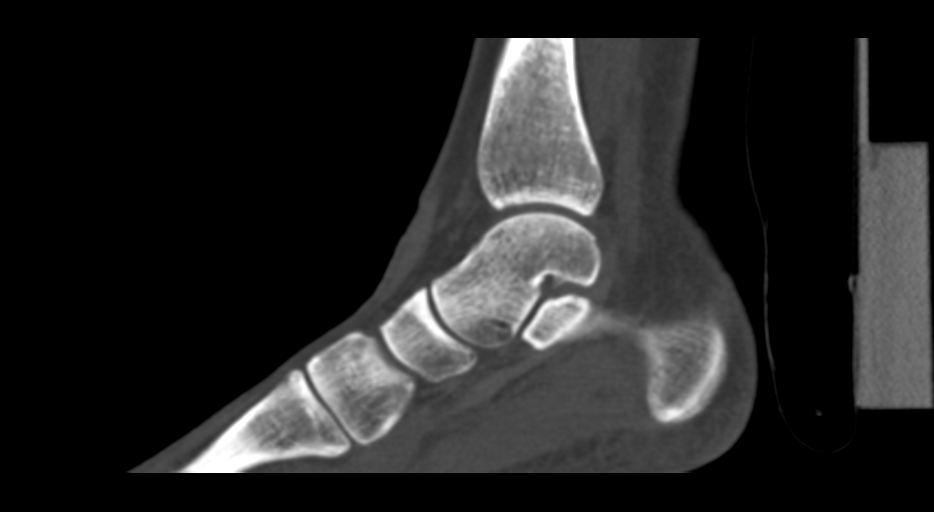
[im 49/59  bone]
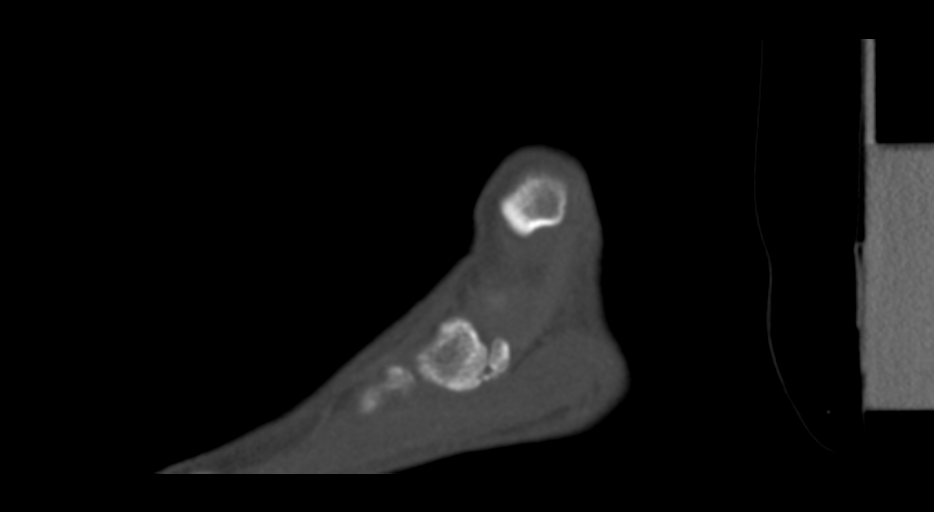

[14 of 33 positions shown; findings below may reference images not displayed]

FINDINGS: Bones/Joint/Cartilage

No acute bony or joint abnormality is seen. No osteochondral lesion
of the talar dome. No tarsal coalition. No lytic or sclerotic
lesion. Accessory ossicle of the navicular noted.

Ligaments

Suboptimally assessed by CT.

Muscles and Tendons

Appear normal.

Soft tissues

None.
IMPRESSION: No acute abnormality. Accessory ossicle of the navicular is noted as
seen on prior MRI.

## 2022-08-03 ENCOUNTER — Other Ambulatory Visit (HOSPITAL_COMMUNITY): Payer: Self-pay

## 2022-08-04 ENCOUNTER — Other Ambulatory Visit (HOSPITAL_COMMUNITY): Payer: Self-pay

## 2022-08-17 ENCOUNTER — Other Ambulatory Visit (HOSPITAL_COMMUNITY): Payer: Self-pay

## 2022-08-20 ENCOUNTER — Other Ambulatory Visit (HOSPITAL_COMMUNITY): Payer: Self-pay

## 2022-08-20 MED ORDER — SPIRONOLACTONE 50 MG PO TABS
50.0000 mg | ORAL_TABLET | Freq: Two times a day (BID) | ORAL | 1 refills | Status: DC
Start: 2022-08-20 — End: 2023-01-08
  Filled 2022-08-20: qty 180, 90d supply, fill #0
  Filled 2022-09-01: qty 59, 30d supply, fill #0
  Filled 2022-09-02: qty 121, 60d supply, fill #0

## 2022-09-01 ENCOUNTER — Other Ambulatory Visit (HOSPITAL_COMMUNITY): Payer: Self-pay

## 2022-09-01 DIAGNOSIS — Z20828 Contact with and (suspected) exposure to other viral communicable diseases: Secondary | ICD-10-CM | POA: Diagnosis not present

## 2022-09-01 DIAGNOSIS — L040 Acute lymphadenitis of face, head and neck: Secondary | ICD-10-CM | POA: Diagnosis not present

## 2022-09-01 DIAGNOSIS — Z20822 Contact with and (suspected) exposure to covid-19: Secondary | ICD-10-CM | POA: Diagnosis not present

## 2022-09-01 DIAGNOSIS — R070 Pain in throat: Secondary | ICD-10-CM | POA: Diagnosis not present

## 2022-09-01 DIAGNOSIS — J029 Acute pharyngitis, unspecified: Secondary | ICD-10-CM | POA: Diagnosis not present

## 2022-09-02 ENCOUNTER — Other Ambulatory Visit (HOSPITAL_COMMUNITY): Payer: Self-pay

## 2022-10-07 ENCOUNTER — Other Ambulatory Visit (HOSPITAL_COMMUNITY): Payer: Self-pay

## 2022-10-08 ENCOUNTER — Other Ambulatory Visit (HOSPITAL_COMMUNITY): Payer: Self-pay

## 2022-10-08 ENCOUNTER — Other Ambulatory Visit: Payer: Self-pay

## 2022-10-09 ENCOUNTER — Other Ambulatory Visit (HOSPITAL_COMMUNITY): Payer: Self-pay

## 2022-11-04 ENCOUNTER — Other Ambulatory Visit (HOSPITAL_COMMUNITY): Payer: Self-pay

## 2022-11-04 DIAGNOSIS — L700 Acne vulgaris: Secondary | ICD-10-CM | POA: Diagnosis not present

## 2022-11-04 MED ORDER — SPIRONOLACTONE 50 MG PO TABS
150.0000 mg | ORAL_TABLET | Freq: Every day | ORAL | 5 refills | Status: AC
Start: 2022-11-04 — End: ?
  Filled 2022-11-04 – 2022-12-07 (×3): qty 270, 90d supply, fill #0
  Filled 2023-02-04: qty 50, 16d supply, fill #0
  Filled 2023-02-04: qty 40, 14d supply, fill #0
  Filled 2023-02-04 – 2023-03-01 (×2): qty 270, 90d supply, fill #1
  Filled 2023-05-31: qty 270, 90d supply, fill #2
  Filled 2023-10-28: qty 270, 90d supply, fill #3

## 2022-11-11 ENCOUNTER — Other Ambulatory Visit (HOSPITAL_COMMUNITY): Payer: Self-pay

## 2022-11-11 ENCOUNTER — Other Ambulatory Visit (HOSPITAL_BASED_OUTPATIENT_CLINIC_OR_DEPARTMENT_OTHER): Payer: Self-pay

## 2022-11-13 ENCOUNTER — Other Ambulatory Visit (HOSPITAL_COMMUNITY): Payer: Self-pay

## 2022-12-07 ENCOUNTER — Other Ambulatory Visit (HOSPITAL_COMMUNITY): Payer: Self-pay

## 2022-12-08 ENCOUNTER — Other Ambulatory Visit: Payer: Self-pay

## 2022-12-15 DIAGNOSIS — G43909 Migraine, unspecified, not intractable, without status migrainosus: Secondary | ICD-10-CM | POA: Diagnosis not present

## 2022-12-15 DIAGNOSIS — R519 Headache, unspecified: Secondary | ICD-10-CM | POA: Diagnosis not present

## 2022-12-15 DIAGNOSIS — Z793 Long term (current) use of hormonal contraceptives: Secondary | ICD-10-CM | POA: Diagnosis not present

## 2022-12-15 DIAGNOSIS — Z79899 Other long term (current) drug therapy: Secondary | ICD-10-CM | POA: Diagnosis not present

## 2023-01-08 ENCOUNTER — Ambulatory Visit: Payer: Commercial Managed Care - PPO | Admitting: Neurology

## 2023-01-08 ENCOUNTER — Encounter: Payer: Self-pay | Admitting: Neurology

## 2023-01-08 VITALS — BP 119/68 | HR 72 | Ht 69.0 in | Wt 160.0 lb

## 2023-01-08 DIAGNOSIS — R519 Headache, unspecified: Secondary | ICD-10-CM | POA: Diagnosis not present

## 2023-01-08 DIAGNOSIS — H93A9 Pulsatile tinnitus, unspecified ear: Secondary | ICD-10-CM | POA: Diagnosis not present

## 2023-01-08 DIAGNOSIS — H5213 Myopia, bilateral: Secondary | ICD-10-CM | POA: Diagnosis not present

## 2023-01-08 DIAGNOSIS — G43901 Migraine, unspecified, not intractable, with status migrainosus: Secondary | ICD-10-CM | POA: Diagnosis not present

## 2023-01-08 DIAGNOSIS — H539 Unspecified visual disturbance: Secondary | ICD-10-CM

## 2023-01-08 DIAGNOSIS — G4453 Primary thunderclap headache: Secondary | ICD-10-CM

## 2023-01-08 DIAGNOSIS — G441 Vascular headache, not elsewhere classified: Secondary | ICD-10-CM

## 2023-01-08 MED ORDER — FREMANEZUMAB-VFRM 225 MG/1.5ML ~~LOC~~ SOSY: 675.0000 mg | PREFILLED_SYRINGE | Freq: Once | SUBCUTANEOUS | Status: AC

## 2023-01-08 MED ORDER — NURTEC 75 MG PO TBDP: 75.0000 mg | ORAL_TABLET | Freq: Every day | ORAL | 0 refills | Status: DC | PRN

## 2023-01-08 NOTE — Patient Instructions (Signed)
MRI/MRA head Blood work Nurtec next 2 weeks then daily as needed 3 injections ajovy Update me on mychart  Migraine Headache A migraine headache is an intense pulsing or throbbing pain on one or both sides of the head. Migraine headaches may also cause other symptoms, such as nausea, vomiting, and sensitivity to light and noise. A migraine headache can last from 4 hours to 3 days. Talk with your health care provider about what things may bring on (trigger) your migraine headaches. What are the causes? The exact cause is not known. However, a migraine may be caused when nerves in the brain get irritated and release chemicals that cause blood vessels to become inflamed. This inflammation causes pain. Migraines may be triggered or caused by: Smoking. Medicines, such as: Nitroglycerin, which is used to treat chest pain. Birth control pills. Estrogen. Certain blood pressure medicines. Foods or drinks that contain nitrates, glutamate, aspartame, MSG, or tyramine. Certain foods or drinks, such as aged cheeses, chocolate, alcohol, or caffeine. Doing physical activity that is very hard. Other triggers may include: Menstruation. Pregnancy. Hunger. Stress. Getting too much or too little sleep. Weather changes. Tiredness (fatigue). What increases the risk? The following factors may make you more likely to have migraine headaches: Being between the ages of 60-87 years old. Being female. Having a family history of migraine headaches. Being Caucasian. Having a mental health condition, such as depression or anxiety. Being obese. What are the signs or symptoms? The main symptom of this condition is pulsing or throbbing pain. This pain may: Happen in any area of the head, such as on one or both sides. Make it hard to do daily activities. Get worse with physical activity. Get worse around bright lights, loud noises, or smells. Other symptoms may include: Nausea. Vomiting. Dizziness. Before a  migraine headache starts, you may get warning signs (an aura). An aura may include: Seeing flashing lights or having blind spots. Seeing bright spots, halos, or zigzag lines. Having tunnel vision or blurred vision. Having numbness or a tingling feeling. Having trouble talking. Having muscle weakness. After a migraine ends, you may have symptoms. These may include: Feeling tired. Trouble concentrating. How is this diagnosed? A migraine headache can be diagnosed based on: Your symptoms. A physical exam. Tests, such as: A CT scan or an MRI of the head. These tests can help rule out other causes of headaches. Taking fluid from the spine (lumbar puncture) to examine it (cerebrospinal fluid analysis, or CSF analysis). How is this treated? This condition may be treated with medicines that: Relieve pain and nausea. Prevent migraines. Treatment may also include: Acupuncture. Lifestyle changes like avoiding foods that trigger migraine headaches. Learning ways to control your body (biofeedback). Talk therapy to help you know and deal with negative thoughts (cognitive behavioral therapy). Follow these instructions at home: Medicines Take over-the-counter and prescription medicines only as told by your provider. Ask your provider if the medicine prescribed to you: Requires you to avoid driving or using machinery. Can cause constipation. You may need to take these actions to prevent or treat constipation: Drink enough fluid to keep your pee (urine) pale yellow. Take over-the-counter or prescription medicines. Eat foods that are high in fiber, such as beans, whole grains, and fresh fruits and vegetables. Limit foods that are high in fat and processed sugars, such as fried or sweet foods. Lifestyle  Do not drink alcohol. Do not use any products that contain nicotine or tobacco. These products include cigarettes, chewing tobacco, and vaping devices,  such as e-cigarettes. If you need help  quitting, ask your provider. Get 7-9 hours of sleep each night, or the amount recommended by your provider. Find ways to manage stress, such as meditation, deep breathing, or yoga. Try to exercise regularly. This can help lessen how bad and how often your migraines occur. General instructions Keep a journal to find out what triggers your migraines, so you can avoid those things. For example, write down: What you eat and drink. How much sleep you get. Any change to your diet or medicines. If you have a migraine headache: Avoid things that make your symptoms worse, such as bright lights. Lie down in a dark, quiet room. Do not drive or use machinery. Ask your provider what activities are safe for you while you have symptoms. Keep all follow-up visits. Your provider will monitor your symptoms and recommend any further treatment. Where to find more information Coalition for Headache and Migraine Patients (CHAMP): headachemigraine.org American Migraine Foundation: americanmigrainefoundation.org National Headache Foundation: headaches.org Contact a health care provider if: You have symptoms that are different or worse than your usual migraine headache symptoms. You have more than 15 days of headaches in one month. Get help right away if: Your migraine headache becomes severe or lasts more than 72 hours. You have a fever or stiff neck. You have vision loss. Your muscles feel weak or like you cannot control them. You lose your balance often or have trouble walking. You faint. You have a seizure. This information is not intended to replace advice given to you by your health care provider. Make sure you discuss any questions you have with your health care provider. Document Revised: 06/22/2022 Document Reviewed: 06/22/2022 Elsevier Patient Education  [place] Injection What is this medication? FREMANEZUMAB (fre ma NEZ ue mab) prevents migraines. It works by blocking a  substance in the body that causes migraines. It is a monoclonal antibody. This medicine may be used for other purposes; ask your health care provider or pharmacist if you have questions. COMMON BRAND NAME(S): AJOVY What should I tell my care team before I take this medication? They need to know if you have any of these conditions: An unusual or allergic reaction to fremanezumab, other medications, foods, dyes, or preservatives Pregnant or trying to get pregnant Breast-feeding How should I use this medication? This medication is injected under the skin. You will be taught how to prepare and give it. Take it as directed on the prescription label. Keep taking it unless your care team tells you to stop. It is important that you put your used needles and syringes in a special sharps container. Do not put them in a trash can. If you do not have a sharps container, call your pharmacist or care team to get one. Talk to your care team about the use of this medication in children. Special care may be needed. Overdosage: If you think you have taken too much of this medicine contact a poison control center or emergency room at once. NOTE: This medicine is only for you. Do not share this medicine with others. What if I miss a dose? If you miss a dose, take it as soon as you can. If it is almost time for your next dose, take only that dose. Do not take double or extra doses. What may interact with this medication? Interactions are not expected. This list may not describe all possible interactions. Give your health care provider a list of all the medicines, herbs,  non-prescription drugs, or dietary supplements you use. Also tell them if you smoke, drink alcohol, or use illegal drugs. Some items may interact with your medicine. What should I watch for while using this medication? Tell your care team if your symptoms do not start to get better or if they get worse. What side effects may I notice from receiving  this medication? Side effects that you should report to your care team as soon as possible: Allergic reactions or angioedema--skin rash, itching or hives, swelling of the face, eyes, lips, tongue, arms, or legs, trouble swallowing or breathing Side effects that usually do not require medical attention (report to your care team if they continue or are bothersome): Pain, redness, or irritation at injection site This list may not describe all possible side effects. Call your doctor for medical advice about side effects. You may report side effects to FDA at 1-800-FDA-1088. Where should I keep my medication? Keep out of the reach of children and pets. Store in a refrigerator or at room temperature between 20 and 25 degrees C (68 and 77 degrees F). Refrigeration (preferred): Store in the refrigerator. Do not freeze. Keep in the original container until you are ready to take it. Remove the dose from the carton about 30 minutes before it is time for you to use it. If the dose is not used, it may be stored in the original container at room temperature for 7 days. Get rid of any unused medication after the expiration date. Room Temperature: This medication may be stored at room temperature for up to 7 days. Keep it in the original container. Protect from light until time of use. If it is stored at room temperature, get rid of any unused medication after 7 days or after it expires, whichever is first. To get rid of medications that are no longer needed or have expired: Take the medication to a medication take-back program. Check with your pharmacy or law enforcement to find a location. If you cannot return the medication, ask your pharmacist or care team how to get rid of this medication safely. NOTE: This sheet is a summary. It may not cover all possible information. If you have questions about this medicine, talk to your doctor, pharmacist, or health care provider.  2023 Elsevier/Gold Standard (2021-12-16  00:00:00) Rimegepant Disintegrating Tablets What is this medication? RIMEGEPANT (ri ME je pant) prevents and treats migraines. It works by blocking a substance in the body that causes migraines. This medicine may be used for other purposes; ask your health care provider or pharmacist if you have questions. COMMON BRAND NAME(S): NURTEC ODT What should I tell my care team before I take this medication? They need to know if you have any of these conditions: Kidney disease Liver disease An unusual or allergic reaction to rimegepant, other medications, foods, dyes, or preservatives Pregnant or trying to get pregnant Breast-feeding How should I use this medication? Take this medication by mouth. Take it as directed on the prescription label. Leave the tablet in the sealed pack until you are ready to take it. With dry hands, open the pack and gently remove the tablet. If the tablet breaks or crumbles, throw it away. Use a new tablet. Place the tablet in the mouth and allow it to dissolve. Then, swallow it. Do not cut, crush, or chew this medication. You do not need water to take this medication. Talk to your care team about the use of this medication in children. Special care may be  needed. Overdosage: If you think you have taken too much of this medicine contact a poison control center or emergency room at once. NOTE: This medicine is only for you. Do not share this medicine with others. What if I miss a dose? This does not apply. This medication is not for regular use. What may interact with this medication? Certain medications for fungal infections, such as fluconazole, itraconazole Rifampin This list may not describe all possible interactions. Give your health care provider a list of all the medicines, herbs, non-prescription drugs, or dietary supplements you use. Also tell them if you smoke, drink alcohol, or use illegal drugs. Some items may interact with your medicine. What should I watch for  while using this medication? Visit your care team for regular checks on your progress. Tell your care team if your symptoms do not start to get better or if they get worse. What side effects may I notice from receiving this medication? Side effects that you should report to your care team as soon as possible: Allergic reactions--skin rash, itching, hives, swelling of the face, lips, tongue, or throat Side effects that usually do not require medical attention (report to your care team if they continue or are bothersome): Nausea Stomach pain This list may not describe all possible side effects. Call your doctor for medical advice about side effects. You may report side effects to FDA at 1-800-FDA-1088. Where should I keep my medication? Keep out of the reach of children and pets. Store at room temperature between 20 and 25 degrees C (68 and 77 degrees F). Get rid of any unused medication after the expiration date. To get rid of medications that are no longer needed or have expired: Take the medication to a medication take-back program. Check with your pharmacy or law enforcement to find a location. If you cannot return the medication, check the label or package insert to see if the medication should be thrown out in the garbage or flushed down the toilet. If you are not sure, ask your care team. If it is safe to put it in the trash, take the medication out of the container. Mix the medication with cat litter, dirt, coffee grounds, or other unwanted substance. Seal the mixture in a bag or container. Put it in the trash. NOTE: This sheet is a summary. It may not cover all possible information. If you have questions about this medicine, talk to your doctor, pharmacist, or health care provider.  2023 Elsevier/Gold Standard (2021-12-17 00:00:00)

## 2023-01-08 NOTE — Progress Notes (Addendum)
[facility]    Provider:  Dr Lucia Gaskins Requesting Provider: Otilio Jefferson, MD Primary Care Provider:  Barbie Banner, MD  CC:  Headaches/migraines  03/02/2023: Patient is doing great. She only has 4 migraines a month and < 6 total headache days a month and the nurtec works. I will prescribe that to her. We will see her back in 3 months. I did "bridge her" with 3x Ajovy when I saw her, if her migraines get worse she could take the nurtec every other day or we could prescribe Ajovy with the nurtec but she would have to try Topiramate and a TCA first. At this time use nurtec prn and see her back in a few months.    From a thorough review of reords: Medications tried in the past for headache/migraine include: propranolol contraindicated due to asthma, ibuprofen, ketorolac, reglan, zofran, imitrex, sumatiptan,nurtec, maxalt, aimovig contraindicated due to constipation, ajovy, nurtec, Bernita Raisin  HPI:  Amanda Hester is a 22 y.o. female here as requested by Otilio Jefferson, MD for daily headaches. PMHx headached, gerd. I reviewed Dr. Lorita Officer notes, she has been in clinic 3x for acute intervention for headache not responding to triptans, IM or oral ketorolac. She also went to the emergency room since. Normal CT at the ED, was given a migraine cocktail without improvement as well, given fioricet which also did not help. Headache for over 2 weeks, now with dizziness and lightheadedness, nausea, lightheadedness not vertigo. Hx of 2 consussions remote. Father with migraine headaches. She was given nurtec and sent for neurology.   Here with her mother who provides information. In the 8th grade had a concussion and then a second within a week of eachother and started having headaches, no LOC, no vomiting, she was out of school for about a week. In HS she had headaches. Father has migraines and cluster headaches. College the headaches improved. 4 weeks ago started having a headache, she woke up with  it, on the second days starting progressing, continuous, pulsating/pounding/throbbing, behind the eyes pressure, forehead and behind the eyes, nausea, stating at a screen made it worse, worse with exercise exertional especially lifting became severe and positional worse with positions epsecially sitting up and tilting head, lasted for weeks and still ongoing, light/phono sensitivity, rest and a quiet room would make it better, she tried multiple medications without improvement. Wax and wane this is the 4th week of the headache, acute woke up with it, thunderclap qualities. She has a headache this morning worse when working out, she has an eye appointment soon, no vomiting. She ha sleep paralyis for 3-4 weeks. No sleep attacks. No cataplexy. Least stressful semesters. She is a clencher and waking up with headaches, morning head. +vision changes. Hears heartbeat in ear. No other focal neurologic deficits, associated symptoms, inciting events or modifiable factors.     Reviewed notes, labs and imaging from outside physicians, which showed :  From a thorough review of reords: Medications tried in the past for headache/migraine include: propranolol contraindicated due to asthma, ibuprofen, ketorolac, reglan, zofran, imitrex, sumatiptan,nurtec, maxalt, aimovig contraindicated due to constipation, ajovy, nurtec  Review of Systems: Patient complains of symptoms per HPI as well as the following symptoms headaches. Pertinent negatives and positives per HPI. All others negative.   Social History   Socioeconomic History   Marital status: Single    Spouse name: Not on file   Number of children: Not on file   Years of education: Not on file  Highest education level: Not on file  Occupational History   Not on file  Tobacco Use   Smoking status: Never   Smokeless tobacco: Never  Vaping Use   Vaping Use: Never used  Substance and Sexual Activity   Alcohol use: Not Currently   Drug use: Never   Sexual  activity: Not on file  Other Topics Concern   Not on file  Social History Narrative   Junior in college   Caffeine: coffee 1 cup/day   Social Determinants of Health   Financial Resource Strain: Not on file  Food Insecurity: Not on file  Transportation Needs: Not on file  Physical Activity: Not on file  Stress: Not on file  Social Connections: Not on file  Intimate Partner Violence: Not on file    Family History  Problem Relation Age of Onset   Migraines Father    Cancer Maternal Grandmother    GI problems Neg Hx     Past Medical History:  Diagnosis Date   Asthma    sports induced   Dyspepsia 08/22/2018   GERD (gastroesophageal reflux disease)     Patient Active Problem List   Diagnosis Date Noted   Acute intractable headache 01/11/2023   Status migrainosus 01/11/2023   Dyspepsia 08/22/2018    Past Surgical History:  Procedure Laterality Date   ANKLE ARTHROSCOPY WITH RECONSTRUCTION Left 11/07/2020   Procedure: LEFT ANKLE ARTHROSCOPIC DEBRIDEMENT,     ARTHROSCOPIC TREATMENT OF TALUS OSTEOCHONDRAL LESION, LATERAL LIGAMENT RECONSTRUCTION;  Surgeon: Terance Hart, MD;  Location: [facility];  Service: Orthopedics;  Laterality: Left;  LENGTH OF SURGERY: 2 HOURS   WISDOM TOOTH EXTRACTION      Current Outpatient Medications  Medication Sig Dispense Refill   albuterol (VENTOLIN HFA) 108 (90 Base) MCG/ACT inhaler INHALE 2 PUFFS INTO THE LUNGS EVERY 4 HRS AS NEEDED FOR COUGHING. ALSO USES BEFORE EXERCISE 8.5 g 5   ALPRAZolam (XANAX) 0.25 MG tablet Take 1-2 tabs (0.25mg -0.50mg ) 30-60 minutes before procedure. May repeat if needed.Do not drive. 4 tablet 0   clindamycin (CLINDACIN ETZ) 1 % SWAB Apply topically to face once daily. 60 each 2   Clindamycin Phos-Benzoyl Perox (ONEXTON) 1.2-3.75 % GEL Apply 1 application to affected area once daily 50 g 1   Clindamycin Phos-Benzoyl Perox (ONEXTON) 1.2-3.75 % GEL Apply 1 application  topically to the affected  area once daily 50 g 1   famotidine (PEPCID) 40 MG tablet Take 1 tablet (40 mg total) by mouth daily. 90 tablet 0   fexofenadine (ALLEGRA) 60 MG tablet Take 60 mg by mouth 2 (two) times daily.     ibuprofen (ADVIL,MOTRIN) 200 MG tablet Take 200 mg by mouth every 6 (six) hours as needed. For foot pain     metroNIDAZOLE (METROCREAM) 0.75 % cream Apply 1 application topically to face in the morning. 45 g 2   norethindrone-ethinyl estradiol (LOESTRIN 1/20, 21,) 1-20 MG-MCG tablet Take 1 tablet by mouth daily. 21 tablet 8   omeprazole (PRILOSEC) 40 MG capsule Take 1 capsule (40 mg total) by mouth 2 (two) times daily. 30 capsule 5   Rimegepant Sulfate (NURTEC) 75 MG TBDP Take 1 tablet (75 mg total) by mouth daily as needed. For migraines. Take as close to onset of migraine as possible. One daily maximum. But for the next 2 weeks take daily and then after that once daily as needed. 16 tablet 0   spironolactone (ALDACTONE) 50 MG tablet Take 3 tablets (150 mg total) by mouth  daily. 270 tablet 5   tretinoin (RETIN-A) 0.05 % cream Apply a small amount to the face at bedtime. Start out 3 nights a week. Gradually increase as tolerated. 45 g 5   Current Facility-Administered Medications  Medication Dose Route Frequency Provider Last Rate Last Admin   Fremanezumab-vfrm SOSY 675 mg  675 mg Subcutaneous Once Anson Fret, MD        Allergies as of 01/08/2023   (No Known Allergies)    Vitals: BP 119/68 (BP Location: Right Arm, Patient Position: Sitting)   Pulse 72   Ht 5\' 9"  (1.753 m)   Wt 160 lb (72.6 kg)   LMP 01/01/2023 (Approximate)   BMI 23.63 kg/m  Last Weight:  Wt Readings from Last 1 Encounters:  01/08/23 160 lb (72.6 kg)   Last Height:   Ht Readings from Last 1 Encounters:  01/08/23 5\' 9"  (1.753 m)     Physical exam: Exam: Gen: NAD, conversant, well nourised, obese, well groomed                     CV: RRR, no MRG. No Carotid Bruits. No peripheral edema, warm, nontender Eyes:  Conjunctivae clear without exudates or hemorrhage  Neuro: Detailed Neurologic Exam  Speech:    Speech is normal; fluent and spontaneous with normal comprehension.  Cognition:    The patient is oriented to person, place, and time;     recent and remote memory intact;     language fluent;     normal attention, concentration,     fund of knowledge Cranial Nerves:    The pupils are equal, round, and reactive to light. The fundi are normal and spontaneous venous pulsations are present. Visual fields are full to finger confrontation. Extraocular movements are intact. Trigeminal sensation is intact and the muscles of mastication are normal. The face is symmetric. The palate elevates in the midline. Hearing intact. Voice is normal. Shoulder shrug is normal. The tongue has normal motion without fasciculations.   Coordination:    Normal finger to nose and heel to shin. Normal rapid alternating movements.   Gait:    Heel-toe and tandem gait are normal.   Motor Observation:    No asymmetry, no atrophy, and no involuntary movements noted. Tone:    Normal muscle tone.    Posture:    Posture is normal. normal erect    Strength:    Strength is V/V in the upper and lower limbs.      Sensation: intact to LT     Reflex Exam:  DTR's:    Deep tendon reflexes in the upper and lower extremities are normal bilaterally.   Toes:    The toes are downgoing bilaterally.   Clonus:    Clonus is absent.    Assessment/Plan:  Patient with new daily headaches, given concerning symptoms needs thorough evaluation  MRI w/wo contrast and MRA head: MRI brain due to concerning symptoms of morning headaches, turderclap, intractable and pulsatile tinnitus headaches,vision changes, worsening headaches  to look for space occupying mass, chiari or intracranial hypertension (pseudotumor), strokes, malignancies, vasculidities, demyelination(multiple sclerosis), aneurysm or other  Give xanax Monday march 11th would  be ideal - has to be within cone system Nurtec for next 2 weeks  03/02/2023: Patient is doing great. She only has 4 migraines a month and < 6 total headache days a month and the nurtec works. I will prescribe that to her. We will see her back in 3 months. I did "  bridge her" with 3x Ajovy when I saw her, if her migraines get worse she could take the nurtec every other day or we could prescribe Ajovy with the nurtec but she would have to try Topiramate and a TCA first. At this time use nurtec prn and see her back in a few months.   From a thorough review of reords: Medications tried in the past for headache/migraine include: propranolol contraindicated due to asthma, ibuprofen, ketorolac, reglan, zofran, imitrex, sumatiptan,nurtec, maxalt, aimovig contraindicated due to constipation, ajovy, nurtec, ubrelvy  MRI brain and MRA head was unremarkable.   Orders Placed This Encounter  Procedures   MR BRAIN W WO CONTRAST   MR ANGIO HEAD WO CONTRAST   CBC with Differential/Platelets   Comprehensive metabolic panel   TSH Rfx on Abnormal to Free T4   Magnesium   Meds ordered this encounter  Medications   Rimegepant Sulfate (NURTEC) 75 MG TBDP    Sig: Take 1 tablet (75 mg total) by mouth daily as needed. For migraines. Take as close to onset of migraine as possible. One daily maximum. But for the next 2 weeks take daily and then after that once daily as needed.    Dispense:  16 tablet    Refill:  0   Fremanezumab-vfrm SOSY 675 mg   ALPRAZolam (XANAX) 0.25 MG tablet    Sig: Take 1-2 tabs (0.25mg -0.50mg ) 30-60 minutes before procedure. May repeat if needed.Do not drive.    Dispense:  4 tablet    Refill:  0   Discussed: To prevent or relieve headaches, try the following: Cool Compress. Lie down and place a cool compress on your head.  Avoid headache triggers. If certain foods or odors seem to have triggered your migraines in the past, avoid them. A headache diary might help you identify triggers.   Include physical activity in your daily routine. Try a daily walk or other moderate aerobic exercise.  Manage stress. Find healthy ways to cope with the stressors, such as delegating tasks on your to-do list.  Practice relaxation techniques. Try deep breathing, yoga, massage and visualization.  Eat regularly. Eating regularly scheduled meals and maintaining a healthy diet might help prevent headaches. Also, drink plenty of fluids.  Follow a regular sleep schedule. Sleep deprivation might contribute to headaches Consider biofeedback. With this mind-body technique, you learn to control certain bodily functions -- such as muscle tension, heart rate and blood pressure -- to prevent headaches or reduce headache pain.    Proceed to emergency room if you experience new or worsening symptoms or symptoms do not resolve, if you have new neurologic symptoms or if headache is severe, or for any concerning symptom.   Provided education and documentation from American headache Society toolbox including articles on: chronic migraine medication overuse headache, chronic migraines, prevention of migraines, behavioral and other nonpharmacologic treatments for headache.  Cc: Otilio Jefferson, MD,  Barbie Banner, MD  Naomie Dean, MD  [facility] [address] [place], [place] [address]  Phone [phone] Fax [phone]

## 2023-01-09 LAB — CBC WITH DIFFERENTIAL/PLATELET
Basophils Absolute: 0 10*3/uL (ref 0.0–0.2)
Basos: 1 %
EOS (ABSOLUTE): 0.1 10*3/uL (ref 0.0–0.4)
Eos: 1 %
Hematocrit: 43 % (ref 34.0–46.6)
Hemoglobin: 14.4 g/dL (ref 11.1–15.9)
Immature Grans (Abs): 0 10*3/uL (ref 0.0–0.1)
Immature Granulocytes: 0 %
Lymphocytes Absolute: 1.7 10*3/uL (ref 0.7–3.1)
Lymphs: 40 %
MCH: 30 pg (ref 26.6–33.0)
MCHC: 33.5 g/dL (ref 31.5–35.7)
MCV: 90 fL (ref 79–97)
Monocytes Absolute: 0.3 10*3/uL (ref 0.1–0.9)
Monocytes: 7 %
Neutrophils Absolute: 2.1 10*3/uL (ref 1.4–7.0)
Neutrophils: 51 %
Platelets: 246 10*3/uL (ref 150–450)
RBC: 4.8 x10E6/uL (ref 3.77–5.28)
RDW: 12.8 % (ref 11.7–15.4)
WBC: 4.2 10*3/uL (ref 3.4–10.8)

## 2023-01-09 LAB — COMPREHENSIVE METABOLIC PANEL
ALT: 14 IU/L (ref 0–32)
AST: 27 IU/L (ref 0–40)
Albumin/Globulin Ratio: 2.1 (ref 1.2–2.2)
Albumin: 4.5 g/dL (ref 4.0–5.0)
Alkaline Phosphatase: 63 IU/L (ref 44–121)
BUN/Creatinine Ratio: 11 (ref 9–23)
BUN: 9 mg/dL (ref 6–20)
Bilirubin Total: 0.7 mg/dL (ref 0.0–1.2)
CO2: 26 mmol/L (ref 20–29)
Calcium: 9.7 mg/dL (ref 8.7–10.2)
Chloride: 103 mmol/L (ref 96–106)
Creatinine, Ser: 0.79 mg/dL (ref 0.57–1.00)
Globulin, Total: 2.1 g/dL (ref 1.5–4.5)
Glucose: 69 mg/dL — ABNORMAL LOW (ref 70–99)
Potassium: 4.6 mmol/L (ref 3.5–5.2)
Sodium: 142 mmol/L (ref 134–144)
Total Protein: 6.6 g/dL (ref 6.0–8.5)
eGFR: 109 mL/min/{1.73_m2} (ref >59)

## 2023-01-09 LAB — TSH RFX ON ABNORMAL TO FREE T4: TSH: 2.81 u[IU]/mL (ref 0.450–4.500)

## 2023-01-09 LAB — MAGNESIUM: Magnesium: 2.1 mg/dL (ref 1.6–2.3)

## 2023-01-11 ENCOUNTER — Encounter: Payer: Self-pay | Admitting: Neurology

## 2023-01-11 DIAGNOSIS — G43901 Migraine, unspecified, not intractable, with status migrainosus: Secondary | ICD-10-CM | POA: Insufficient documentation

## 2023-01-11 DIAGNOSIS — R519 Headache, unspecified: Secondary | ICD-10-CM | POA: Insufficient documentation

## 2023-01-11 MED ORDER — ALPRAZOLAM 0.25 MG PO TABS
ORAL_TABLET | ORAL | 0 refills | Status: AC
  Filled 2023-01-11 – 2023-02-02 (×3): qty 4, 1d supply, fill #0

## 2023-01-12 ENCOUNTER — Other Ambulatory Visit (HOSPITAL_COMMUNITY): Payer: Self-pay

## 2023-01-12 ENCOUNTER — Other Ambulatory Visit: Payer: Self-pay

## 2023-01-12 ENCOUNTER — Telehealth: Payer: Self-pay | Admitting: Neurology

## 2023-01-12 NOTE — Telephone Encounter (Signed)
[employer] sent to [facility] they obtain auth [id number]

## 2023-01-28 ENCOUNTER — Other Ambulatory Visit (HOSPITAL_BASED_OUTPATIENT_CLINIC_OR_DEPARTMENT_OTHER): Payer: Self-pay

## 2023-01-28 ENCOUNTER — Other Ambulatory Visit (HOSPITAL_COMMUNITY): Payer: Self-pay

## 2023-02-01 ENCOUNTER — Other Ambulatory Visit (HOSPITAL_COMMUNITY): Payer: Self-pay

## 2023-02-02 ENCOUNTER — Other Ambulatory Visit (HOSPITAL_COMMUNITY): Payer: Self-pay

## 2023-02-03 ENCOUNTER — Ambulatory Visit
Admission: RE | Admit: 2023-02-03 | Discharge: 2023-02-03 | Disposition: A | Discharge status: Home / Self Care | Payer: Commercial Managed Care - PPO | Source: Ambulatory Visit | Attending: Neurology | Admitting: Neurology

## 2023-02-03 DIAGNOSIS — R519 Headache, unspecified: Secondary | ICD-10-CM | POA: Diagnosis not present

## 2023-02-03 DIAGNOSIS — G43901 Migraine, unspecified, not intractable, with status migrainosus: Secondary | ICD-10-CM

## 2023-02-03 DIAGNOSIS — H539 Unspecified visual disturbance: Secondary | ICD-10-CM

## 2023-02-03 DIAGNOSIS — G4453 Primary thunderclap headache: Secondary | ICD-10-CM

## 2023-02-03 DIAGNOSIS — G441 Vascular headache, not elsewhere classified: Secondary | ICD-10-CM

## 2023-02-03 DIAGNOSIS — H93A9 Pulsatile tinnitus, unspecified ear: Secondary | ICD-10-CM | POA: Diagnosis not present

## 2023-02-03 MED ORDER — GADOPICLENOL 0.5 MMOL/ML IV SOLN
7.5000 mL | Freq: Once | INTRAVENOUS | Status: AC | PRN
  Administered 2023-02-03: 7.5 mL via INTRAVENOUS

## 2023-02-04 ENCOUNTER — Other Ambulatory Visit (HOSPITAL_BASED_OUTPATIENT_CLINIC_OR_DEPARTMENT_OTHER): Payer: Self-pay

## 2023-02-10 ENCOUNTER — Other Ambulatory Visit (HOSPITAL_COMMUNITY): Payer: Self-pay

## 2023-02-15 ENCOUNTER — Ambulatory Visit: Payer: Commercial Managed Care - PPO | Admitting: Neurology

## 2023-03-01 ENCOUNTER — Other Ambulatory Visit (HOSPITAL_COMMUNITY): Payer: Self-pay

## 2023-03-01 ENCOUNTER — Other Ambulatory Visit: Payer: Self-pay | Admitting: Neurology

## 2023-03-01 ENCOUNTER — Encounter: Payer: Self-pay | Admitting: Neurology

## 2023-03-01 ENCOUNTER — Other Ambulatory Visit: Payer: Self-pay

## 2023-03-01 DIAGNOSIS — G43901 Migraine, unspecified, not intractable, with status migrainosus: Secondary | ICD-10-CM

## 2023-03-01 MED ORDER — NURTEC 75 MG PO TBDP: 75.0000 mg | ORAL_TABLET | Freq: Every day | ORAL | 0 refills | Status: DC | PRN

## 2023-03-02 ENCOUNTER — Other Ambulatory Visit (HOSPITAL_COMMUNITY): Payer: Self-pay

## 2023-03-02 ENCOUNTER — Encounter (HOSPITAL_COMMUNITY): Payer: Self-pay

## 2023-03-02 ENCOUNTER — Telehealth: Payer: Self-pay | Admitting: Neurology

## 2023-03-02 MED ORDER — NURTEC 75 MG PO TBDP
75.0000 mg | ORAL_TABLET | Freq: Every day | ORAL | 11 refills | Status: AC | PRN
  Filled 2023-03-02: qty 16, 30d supply, fill #0
  Filled 2023-03-09: qty 8, 15d supply, fill #0
  Filled 2023-05-31: qty 8, 15d supply, fill #1
  Filled 2023-09-06: qty 8, 26d supply, fill #2
  Filled 2023-10-28 – 2023-12-10 (×2): qty 8, 26d supply, fill #3
  Filled 2024-01-25: qty 8, 26d supply, fill #4

## 2023-03-02 NOTE — Telephone Encounter (Signed)
I have  few open spots in June can we squeeze her in with me for a virtual visit? Thank you!

## 2023-03-02 NOTE — Addendum Note (Signed)
Addended by: Naomie Dean B on: 03/02/2023 03:52 PM   Modules accepted: Orders

## 2023-03-03 ENCOUNTER — Other Ambulatory Visit (HOSPITAL_COMMUNITY): Payer: Self-pay

## 2023-03-04 ENCOUNTER — Other Ambulatory Visit (HOSPITAL_COMMUNITY): Payer: Self-pay

## 2023-03-06 ENCOUNTER — Other Ambulatory Visit (HOSPITAL_COMMUNITY): Payer: Self-pay

## 2023-03-09 ENCOUNTER — Other Ambulatory Visit (HOSPITAL_COMMUNITY): Payer: Self-pay

## 2023-03-10 ENCOUNTER — Other Ambulatory Visit: Payer: Self-pay

## 2023-03-18 ENCOUNTER — Other Ambulatory Visit (HOSPITAL_COMMUNITY): Payer: Self-pay

## 2023-04-01 ENCOUNTER — Other Ambulatory Visit (HOSPITAL_COMMUNITY): Payer: Self-pay

## 2023-04-01 MED ORDER — METRONIDAZOLE 0.75 % EX CREA
TOPICAL_CREAM | Freq: Every morning | CUTANEOUS | 2 refills | Status: AC
  Filled 2023-04-01 – 2023-05-31 (×2): qty 45, 30d supply, fill #0
  Filled 2023-09-14: qty 45, 30d supply, fill #1
  Filled 2024-01-25: qty 45, 30d supply, fill #2

## 2023-04-01 MED ORDER — CLINDAMYCIN PHOS-BENZOYL PEROX 1.2-3.75 % EX GEL
Freq: Every day | CUTANEOUS | 1 refills | Status: AC
  Filled 2023-04-01 – 2023-05-31 (×2): qty 50, 30d supply, fill #0
  Filled 2023-09-14: qty 50, 30d supply, fill #1

## 2023-04-01 MED ORDER — TRETINOIN 0.05 % EX CREA
TOPICAL_CREAM | Freq: Every day | CUTANEOUS | 2 refills | Status: AC
  Filled 2023-04-01: qty 45, 30d supply, fill #0
  Filled 2023-05-31: qty 45, 90d supply, fill #0
  Filled 2023-09-14: qty 45, 90d supply, fill #1
  Filled 2023-10-28: qty 45, 90d supply, fill #2

## 2023-04-01 MED ORDER — SPIRONOLACTONE 50 MG PO TABS
150.0000 mg | ORAL_TABLET | Freq: Every day | ORAL | 1 refills | Status: DC
  Filled 2023-04-01 – 2023-09-02 (×2): qty 270, 90d supply, fill #0
  Filled 2023-12-10: qty 270, 90d supply, fill #1

## 2023-04-02 ENCOUNTER — Other Ambulatory Visit (HOSPITAL_COMMUNITY): Payer: Self-pay

## 2023-04-17 ENCOUNTER — Other Ambulatory Visit (HOSPITAL_COMMUNITY): Payer: Self-pay

## 2023-04-20 ENCOUNTER — Other Ambulatory Visit (HOSPITAL_COMMUNITY): Payer: Self-pay

## 2023-04-20 ENCOUNTER — Telehealth: Payer: Commercial Managed Care - PPO | Admitting: Neurology

## 2023-04-29 ENCOUNTER — Other Ambulatory Visit (HOSPITAL_BASED_OUTPATIENT_CLINIC_OR_DEPARTMENT_OTHER): Payer: Self-pay

## 2023-05-31 ENCOUNTER — Other Ambulatory Visit (HOSPITAL_COMMUNITY): Payer: Self-pay

## 2023-06-01 ENCOUNTER — Other Ambulatory Visit: Payer: Self-pay

## 2023-06-01 ENCOUNTER — Other Ambulatory Visit (HOSPITAL_COMMUNITY): Payer: Self-pay

## 2023-07-20 ENCOUNTER — Telehealth: Payer: Commercial Managed Care - PPO | Admitting: Neurology

## 2023-09-02 ENCOUNTER — Other Ambulatory Visit (HOSPITAL_COMMUNITY): Payer: Self-pay

## 2023-09-06 ENCOUNTER — Other Ambulatory Visit (HOSPITAL_BASED_OUTPATIENT_CLINIC_OR_DEPARTMENT_OTHER): Payer: Self-pay

## 2023-09-07 ENCOUNTER — Other Ambulatory Visit: Payer: Self-pay

## 2023-09-07 ENCOUNTER — Other Ambulatory Visit (HOSPITAL_BASED_OUTPATIENT_CLINIC_OR_DEPARTMENT_OTHER): Payer: Self-pay

## 2023-09-15 ENCOUNTER — Other Ambulatory Visit: Payer: Self-pay

## 2023-09-15 ENCOUNTER — Other Ambulatory Visit (HOSPITAL_COMMUNITY): Payer: Self-pay

## 2023-09-16 ENCOUNTER — Other Ambulatory Visit (HOSPITAL_COMMUNITY): Payer: Self-pay

## 2023-10-27 ENCOUNTER — Other Ambulatory Visit (HOSPITAL_COMMUNITY): Payer: Self-pay

## 2023-10-27 MED ORDER — METRONIDAZOLE 0.75 % EX CREA
TOPICAL_CREAM | Freq: Every morning | CUTANEOUS | 5 refills | Status: AC
  Filled 2023-10-27 – 2024-07-04 (×2): qty 45, 30d supply, fill #0

## 2023-10-27 MED ORDER — TRETINOIN 0.05 % EX CREA
TOPICAL_CREAM | Freq: Every day | CUTANEOUS | 5 refills | Status: AC
  Filled 2023-10-27 – 2024-07-04 (×2): qty 45, 30d supply, fill #0

## 2023-10-28 ENCOUNTER — Other Ambulatory Visit (HOSPITAL_COMMUNITY): Payer: Self-pay

## 2023-10-28 ENCOUNTER — Other Ambulatory Visit (HOSPITAL_BASED_OUTPATIENT_CLINIC_OR_DEPARTMENT_OTHER): Payer: Self-pay

## 2023-10-28 ENCOUNTER — Other Ambulatory Visit: Payer: Self-pay

## 2023-10-29 ENCOUNTER — Other Ambulatory Visit (HOSPITAL_BASED_OUTPATIENT_CLINIC_OR_DEPARTMENT_OTHER): Payer: Self-pay

## 2023-10-29 ENCOUNTER — Other Ambulatory Visit (HOSPITAL_COMMUNITY): Payer: Self-pay

## 2023-11-08 ENCOUNTER — Other Ambulatory Visit (HOSPITAL_COMMUNITY): Payer: Self-pay

## 2023-11-09 ENCOUNTER — Other Ambulatory Visit (HOSPITAL_COMMUNITY): Payer: Self-pay

## 2023-11-09 ENCOUNTER — Other Ambulatory Visit: Payer: Self-pay

## 2023-11-29 ENCOUNTER — Other Ambulatory Visit (HOSPITAL_BASED_OUTPATIENT_CLINIC_OR_DEPARTMENT_OTHER): Payer: Self-pay

## 2023-12-11 ENCOUNTER — Other Ambulatory Visit (HOSPITAL_BASED_OUTPATIENT_CLINIC_OR_DEPARTMENT_OTHER): Payer: Self-pay

## 2023-12-12 ENCOUNTER — Other Ambulatory Visit (HOSPITAL_BASED_OUTPATIENT_CLINIC_OR_DEPARTMENT_OTHER): Payer: Self-pay

## 2023-12-13 ENCOUNTER — Other Ambulatory Visit (HOSPITAL_BASED_OUTPATIENT_CLINIC_OR_DEPARTMENT_OTHER): Payer: Self-pay

## 2023-12-17 ENCOUNTER — Other Ambulatory Visit (HOSPITAL_COMMUNITY): Payer: Self-pay

## 2023-12-17 ENCOUNTER — Telehealth: Payer: Self-pay

## 2023-12-17 ENCOUNTER — Other Ambulatory Visit: Payer: Self-pay

## 2023-12-17 ENCOUNTER — Other Ambulatory Visit (HOSPITAL_BASED_OUTPATIENT_CLINIC_OR_DEPARTMENT_OTHER): Payer: Self-pay

## 2023-12-17 NOTE — Telephone Encounter (Signed)
*  [facility]  Pharmacy Patient Advocate Encounter   Received notification from Fax that prior authorization for Nurtec 75MG  dispersible tablets  is required/requested.   Insurance verification completed.   The patient is insured through [facility] .   Per test claim: PA required; PA submitted to above mentioned insurance via CoverMyMeds Key/confirmation #/EOC [id number] Status is pending

## 2023-12-18 ENCOUNTER — Other Ambulatory Visit (HOSPITAL_BASED_OUTPATIENT_CLINIC_OR_DEPARTMENT_OTHER): Payer: Self-pay

## 2023-12-23 ENCOUNTER — Other Ambulatory Visit (HOSPITAL_BASED_OUTPATIENT_CLINIC_OR_DEPARTMENT_OTHER): Payer: Self-pay

## 2023-12-24 ENCOUNTER — Other Ambulatory Visit (HOSPITAL_BASED_OUTPATIENT_CLINIC_OR_DEPARTMENT_OTHER): Payer: Self-pay

## 2023-12-27 NOTE — Telephone Encounter (Signed)
Additional information submitted via fax to plan, pending determination.

## 2023-12-27 NOTE — Telephone Encounter (Signed)
Additional information needed on how patient is doing on med-last appt was March 24'

## 2023-12-29 ENCOUNTER — Other Ambulatory Visit: Payer: Self-pay

## 2023-12-30 ENCOUNTER — Other Ambulatory Visit (HOSPITAL_COMMUNITY): Payer: Self-pay

## 2023-12-30 NOTE — Telephone Encounter (Signed)
Pharmacy Patient Advocate Encounter  Received notification from [facility] that Prior Authorization for Nurtec has been APPROVED from 12/29/2023 to 12/27/2024. Unable to obtain price due to refill too soon rejection, last fill date 12/29/2023 next available fill date3/09/2024   PA #/Case ID/Reference #: [id number]

## 2023-12-31 ENCOUNTER — Other Ambulatory Visit (HOSPITAL_BASED_OUTPATIENT_CLINIC_OR_DEPARTMENT_OTHER): Payer: Self-pay

## 2024-01-03 DIAGNOSIS — L700 Acne vulgaris: Secondary | ICD-10-CM | POA: Diagnosis not present

## 2024-01-24 ENCOUNTER — Other Ambulatory Visit (HOSPITAL_BASED_OUTPATIENT_CLINIC_OR_DEPARTMENT_OTHER): Payer: Self-pay

## 2024-01-24 ENCOUNTER — Other Ambulatory Visit (HOSPITAL_COMMUNITY): Payer: Self-pay

## 2024-01-24 MED ORDER — SPIRONOLACTONE 50 MG PO TABS
150.0000 mg | ORAL_TABLET | Freq: Every day | ORAL | 1 refills | Status: AC
  Filled 2024-01-24 – 2024-06-13 (×3): qty 270, 90d supply, fill #0
  Filled 2024-09-14: qty 270, 90d supply, fill #1

## 2024-01-25 ENCOUNTER — Other Ambulatory Visit (HOSPITAL_COMMUNITY): Payer: Self-pay

## 2024-01-25 ENCOUNTER — Other Ambulatory Visit: Payer: Self-pay

## 2024-01-25 ENCOUNTER — Other Ambulatory Visit (HOSPITAL_BASED_OUTPATIENT_CLINIC_OR_DEPARTMENT_OTHER): Payer: Self-pay

## 2024-01-25 DIAGNOSIS — H5213 Myopia, bilateral: Secondary | ICD-10-CM | POA: Diagnosis not present

## 2024-01-25 DIAGNOSIS — H52223 Regular astigmatism, bilateral: Secondary | ICD-10-CM | POA: Diagnosis not present

## 2024-01-28 ENCOUNTER — Other Ambulatory Visit (HOSPITAL_BASED_OUTPATIENT_CLINIC_OR_DEPARTMENT_OTHER): Payer: Self-pay

## 2024-01-31 ENCOUNTER — Other Ambulatory Visit (HOSPITAL_BASED_OUTPATIENT_CLINIC_OR_DEPARTMENT_OTHER): Payer: Self-pay

## 2024-01-31 MED ORDER — SPIRONOLACTONE 50 MG PO TABS
150.0000 mg | ORAL_TABLET | Freq: Every day | ORAL | 1 refills | Status: AC
  Filled 2024-01-31 – 2024-03-13 (×2): qty 270, 90d supply, fill #0

## 2024-02-21 ENCOUNTER — Encounter (HOSPITAL_COMMUNITY): Payer: Self-pay

## 2024-02-21 ENCOUNTER — Other Ambulatory Visit (HOSPITAL_BASED_OUTPATIENT_CLINIC_OR_DEPARTMENT_OTHER): Payer: Self-pay

## 2024-02-21 ENCOUNTER — Other Ambulatory Visit (HOSPITAL_COMMUNITY): Payer: Self-pay

## 2024-02-22 ENCOUNTER — Other Ambulatory Visit (HOSPITAL_BASED_OUTPATIENT_CLINIC_OR_DEPARTMENT_OTHER): Payer: Self-pay

## 2024-02-22 MED ORDER — CLINDAMYCIN PHOS-BENZOYL PEROX 1.2-5 % EX GEL
Freq: Every day | CUTANEOUS | 2 refills | Status: AC
  Filled 2024-02-22 – 2024-06-13 (×2): qty 50, 30d supply, fill #0
  Filled 2024-07-24: qty 45, 30d supply, fill #0
  Filled 2024-09-14: qty 45, 30d supply, fill #1

## 2024-03-13 ENCOUNTER — Other Ambulatory Visit (HOSPITAL_BASED_OUTPATIENT_CLINIC_OR_DEPARTMENT_OTHER): Payer: Self-pay

## 2024-06-13 ENCOUNTER — Other Ambulatory Visit (HOSPITAL_BASED_OUTPATIENT_CLINIC_OR_DEPARTMENT_OTHER): Payer: Self-pay

## 2024-06-14 ENCOUNTER — Other Ambulatory Visit (HOSPITAL_BASED_OUTPATIENT_CLINIC_OR_DEPARTMENT_OTHER): Payer: Self-pay

## 2024-06-16 ENCOUNTER — Other Ambulatory Visit (HOSPITAL_BASED_OUTPATIENT_CLINIC_OR_DEPARTMENT_OTHER): Payer: Self-pay

## 2024-06-17 ENCOUNTER — Other Ambulatory Visit (HOSPITAL_BASED_OUTPATIENT_CLINIC_OR_DEPARTMENT_OTHER): Payer: Self-pay

## 2024-06-30 ENCOUNTER — Other Ambulatory Visit (HOSPITAL_COMMUNITY): Payer: Self-pay

## 2024-06-30 MED ORDER — CLINDAMYCIN PHOS-BENZOYL PEROX 1.2-3.75 % EX GEL
1.0000 | Freq: Every day | CUTANEOUS | 2 refills | Status: AC
  Filled 2024-06-30: qty 50, 30d supply, fill #0

## 2024-06-30 MED ORDER — CLINDAMYCIN PHOSPHATE 1 % EX LOTN
1.0000 | TOPICAL_LOTION | Freq: Every morning | CUTANEOUS | 2 refills | Status: AC
  Filled 2024-06-30 – 2024-07-06 (×2): qty 60, 30d supply, fill #0

## 2024-07-04 ENCOUNTER — Other Ambulatory Visit (HOSPITAL_BASED_OUTPATIENT_CLINIC_OR_DEPARTMENT_OTHER): Payer: Self-pay

## 2024-07-06 ENCOUNTER — Other Ambulatory Visit (HOSPITAL_BASED_OUTPATIENT_CLINIC_OR_DEPARTMENT_OTHER): Payer: Self-pay

## 2024-07-07 ENCOUNTER — Other Ambulatory Visit (HOSPITAL_COMMUNITY): Payer: Self-pay

## 2024-07-21 ENCOUNTER — Other Ambulatory Visit (HOSPITAL_BASED_OUTPATIENT_CLINIC_OR_DEPARTMENT_OTHER): Payer: Self-pay

## 2024-07-21 ENCOUNTER — Other Ambulatory Visit (HOSPITAL_COMMUNITY): Payer: Self-pay

## 2024-07-21 MED ORDER — CLINDAMYCIN PHOS-BENZOYL PEROX 1.2-5 % EX GEL
1.0000 | Freq: Every day | CUTANEOUS | 1 refills | Status: AC
  Filled 2024-07-21: qty 45, 30d supply, fill #0

## 2024-07-24 ENCOUNTER — Other Ambulatory Visit (HOSPITAL_BASED_OUTPATIENT_CLINIC_OR_DEPARTMENT_OTHER): Payer: Self-pay

## 2024-07-24 ENCOUNTER — Other Ambulatory Visit (HOSPITAL_COMMUNITY): Payer: Self-pay

## 2024-09-14 ENCOUNTER — Other Ambulatory Visit (HOSPITAL_BASED_OUTPATIENT_CLINIC_OR_DEPARTMENT_OTHER): Payer: Self-pay

## 2024-10-04 ENCOUNTER — Other Ambulatory Visit: Payer: Self-pay

## 2024-10-04 ENCOUNTER — Other Ambulatory Visit (HOSPITAL_COMMUNITY): Payer: Self-pay

## 2024-10-04 DIAGNOSIS — L700 Acne vulgaris: Secondary | ICD-10-CM | POA: Diagnosis not present

## 2024-10-04 MED ORDER — CLINDAMYCIN PHOS-BENZOYL PEROX 1-5 % EX GEL
1.0000 | Freq: Every day | CUTANEOUS | 2 refills | Status: AC
  Filled 2024-10-04 (×2): qty 25, 30d supply, fill #0
  Filled 2024-10-13: qty 50, 50d supply, fill #0

## 2024-10-04 MED ORDER — DOXYCYCLINE HYCLATE 100 MG PO CAPS
100.0000 mg | ORAL_CAPSULE | Freq: Every day | ORAL | 2 refills | Status: AC
  Filled 2024-10-04 – 2024-10-13 (×2): qty 30, 30d supply, fill #0

## 2024-10-04 MED ORDER — METRONIDAZOLE 0.75 % EX CREA
1.0000 | TOPICAL_CREAM | Freq: Every morning | CUTANEOUS | 5 refills | Status: AC
  Filled 2024-10-04: qty 45, 30d supply, fill #0
  Filled 2024-10-13: qty 45, 45d supply, fill #0

## 2024-10-13 ENCOUNTER — Other Ambulatory Visit (HOSPITAL_BASED_OUTPATIENT_CLINIC_OR_DEPARTMENT_OTHER): Payer: Self-pay

## 2024-10-13 ENCOUNTER — Other Ambulatory Visit (HOSPITAL_COMMUNITY): Payer: Self-pay
